# Patient Record
Sex: Male | Born: 1950 | Race: White | Hispanic: No | Marital: Married | Smoking: Current every day smoker
Health system: Southern US, Community
[De-identification: ages and names within clinical notes are randomized; demographics above are authoritative.]

## PROBLEM LIST (undated history)

## (undated) DIAGNOSIS — M1990 Unspecified osteoarthritis, unspecified site: Secondary | ICD-10-CM

## (undated) DIAGNOSIS — R011 Cardiac murmur, unspecified: Secondary | ICD-10-CM

## (undated) DIAGNOSIS — R0683 Snoring: Secondary | ICD-10-CM

## (undated) DIAGNOSIS — M2240 Chondromalacia patellae, unspecified knee: Secondary | ICD-10-CM

## (undated) DIAGNOSIS — I10 Essential (primary) hypertension: Secondary | ICD-10-CM

## (undated) HISTORY — PX: BACK SURGERY: SHX140

## (undated) HISTORY — PX: TONSILLECTOMY: SUR1361

## (undated) HISTORY — PX: BUNIONECTOMY: SHX129

---

## 2002-04-08 ENCOUNTER — Emergency Department (HOSPITAL_COMMUNITY): Admission: EM | Admit: 2002-04-08 | Discharge: 2002-04-08 | Payer: Self-pay | Admitting: Emergency Medicine

## 2015-02-14 ENCOUNTER — Encounter (HOSPITAL_BASED_OUTPATIENT_CLINIC_OR_DEPARTMENT_OTHER): Payer: Self-pay | Admitting: *Deleted

## 2015-02-14 ENCOUNTER — Other Ambulatory Visit: Payer: Self-pay | Admitting: Physician Assistant

## 2015-02-14 NOTE — H&P (Signed)
Thomas Atkinson is seen in follow up after his MRI is complete.  Tear of the posterior horn medial meniscus.  Looking at the scan these are a little bit more pronounced than the report would suggest.  There is definitely a tear present.  There is some mucoid degeneration of his ACL with a ganglion cyst in his ACL.  Mild to moderate tricompartmental changes, however his standing four view x-ray really does look quite good.  He continues to have marked mechanical symptoms when he goes into flexion.  He feels like this is more lateral, but his scan suggests more of a medial meniscus tear.  He does have some fullness in the back of his knee and the scan does show a moderate Bakers cyst.  I have gone over his scan, his x-rays, workup and treatment to date.    EXAMINATION: Lungs clear to auscultation bilaterally.  Hears sounds normal.  Exam of his right knee: his ACL definitely feels stable.  Positive medial McMurray's.  A little sore laterally.    DISPOSITION:  We have discussed definitive treatment.  Exam under anesthesia, arthroscopy.  He is having marked symptoms.  Anything he does that is not straight ahead causes marked mechanical symptoms.  He would like to proceed.  I think this is the most reasonable approach to take.  Paperwork complete.  All questions answered.  I will see him at the time of operative intervention.    Loreta Aveaniel F. Murphy, M.D.

## 2015-02-17 ENCOUNTER — Ambulatory Visit (HOSPITAL_BASED_OUTPATIENT_CLINIC_OR_DEPARTMENT_OTHER): Payer: PPO | Admitting: Anesthesiology

## 2015-02-17 ENCOUNTER — Encounter (HOSPITAL_BASED_OUTPATIENT_CLINIC_OR_DEPARTMENT_OTHER)
Admission: RE | Disposition: A | Discharge status: Home / Self Care | Payer: Self-pay | Source: Ambulatory Visit | Attending: Orthopedic Surgery

## 2015-02-17 ENCOUNTER — Encounter (HOSPITAL_BASED_OUTPATIENT_CLINIC_OR_DEPARTMENT_OTHER): Payer: Self-pay | Admitting: *Deleted

## 2015-02-17 ENCOUNTER — Ambulatory Visit (HOSPITAL_BASED_OUTPATIENT_CLINIC_OR_DEPARTMENT_OTHER)
Admission: RE | Admit: 2015-02-17 | Discharge: 2015-02-17 | Disposition: A | Discharge status: Home / Self Care | Payer: PPO | Source: Ambulatory Visit | Attending: Orthopedic Surgery | Admitting: Orthopedic Surgery

## 2015-02-17 DIAGNOSIS — M2241 Chondromalacia patellae, right knee: Secondary | ICD-10-CM | POA: Diagnosis not present

## 2015-02-17 DIAGNOSIS — F17200 Nicotine dependence, unspecified, uncomplicated: Secondary | ICD-10-CM | POA: Insufficient documentation

## 2015-02-17 DIAGNOSIS — M23321 Other meniscus derangements, posterior horn of medial meniscus, right knee: Secondary | ICD-10-CM | POA: Diagnosis not present

## 2015-02-17 DIAGNOSIS — M238X1 Other internal derangements of right knee: Secondary | ICD-10-CM | POA: Diagnosis not present

## 2015-02-17 DIAGNOSIS — M67461 Ganglion, right knee: Secondary | ICD-10-CM | POA: Diagnosis not present

## 2015-02-17 DIAGNOSIS — I10 Essential (primary) hypertension: Secondary | ICD-10-CM | POA: Diagnosis not present

## 2015-02-17 DIAGNOSIS — M7121 Synovial cyst of popliteal space [Baker], right knee: Secondary | ICD-10-CM | POA: Diagnosis not present

## 2015-02-17 DIAGNOSIS — M23221 Derangement of posterior horn of medial meniscus due to old tear or injury, right knee: Secondary | ICD-10-CM | POA: Insufficient documentation

## 2015-02-17 HISTORY — DX: Snoring: R06.83

## 2015-02-17 HISTORY — DX: Essential (primary) hypertension: I10

## 2015-02-17 HISTORY — DX: Unspecified osteoarthritis, unspecified site: M19.90

## 2015-02-17 HISTORY — DX: Cardiac murmur, unspecified: R01.1

## 2015-02-17 HISTORY — PX: KNEE ARTHROSCOPY WITH MEDIAL MENISECTOMY: SHX5651

## 2015-02-17 HISTORY — DX: Chondromalacia patellae, unspecified knee: M22.40

## 2015-02-17 SURGERY — ARTHROSCOPY, KNEE, WITH MEDIAL MENISCECTOMY
Anesthesia: General | Site: Knee | Laterality: Right

## 2015-02-17 MED ORDER — EPHEDRINE SULFATE 50 MG/ML IJ SOLN
INTRAMUSCULAR | Status: DC | PRN
  Administered 2015-02-17: 5 mg via INTRAVENOUS
  Administered 2015-02-17: 10 mg via INTRAVENOUS

## 2015-02-17 MED ORDER — BUPIVACAINE HCL (PF) 0.5 % IJ SOLN
INTRAMUSCULAR | Status: AC
  Filled 2015-02-17: qty 60

## 2015-02-17 MED ORDER — CEFAZOLIN SODIUM-DEXTROSE 2-3 GM-% IV SOLR
INTRAVENOUS | Status: AC
  Filled 2015-02-17: qty 50

## 2015-02-17 MED ORDER — CEFAZOLIN SODIUM-DEXTROSE 2-3 GM-% IV SOLR
2.0000 g | INTRAVENOUS | Status: AC
  Administered 2015-02-17: 2 g via INTRAVENOUS

## 2015-02-17 MED ORDER — ONDANSETRON HCL 4 MG PO TABS
4.0000 mg | ORAL_TABLET | Freq: Four times a day (QID) | ORAL | Status: DC | PRN
Start: 2015-02-17 — End: 2015-02-17

## 2015-02-17 MED ORDER — PROPOFOL 10 MG/ML IV BOLUS
INTRAVENOUS | Status: DC | PRN
  Administered 2015-02-17: 200 mg via INTRAVENOUS

## 2015-02-17 MED ORDER — ONDANSETRON HCL 4 MG/2ML IJ SOLN
INTRAMUSCULAR | Status: DC | PRN
  Administered 2015-02-17: 4 mg via INTRAVENOUS

## 2015-02-17 MED ORDER — ONDANSETRON HCL 4 MG/2ML IJ SOLN: 4.0000 mg | Freq: Once | INTRAMUSCULAR | Status: DC | PRN

## 2015-02-17 MED ORDER — METHYLPREDNISOLONE ACETATE 80 MG/ML IJ SUSP
INTRAMUSCULAR | Status: DC | PRN
  Administered 2015-02-17: 80 mg

## 2015-02-17 MED ORDER — BUPIVACAINE HCL (PF) 0.25 % IJ SOLN
INTRAMUSCULAR | Status: AC
  Filled 2015-02-17: qty 60

## 2015-02-17 MED ORDER — ONDANSETRON HCL 4 MG/2ML IJ SOLN: 4.0000 mg | Freq: Four times a day (QID) | INTRAMUSCULAR | Status: DC | PRN

## 2015-02-17 MED ORDER — MIDAZOLAM HCL 2 MG/2ML IJ SOLN
1.0000 mg | INTRAMUSCULAR | Status: DC | PRN
  Administered 2015-02-17: 2 mg via INTRAVENOUS

## 2015-02-17 MED ORDER — OXYCODONE-ACETAMINOPHEN 5-325 MG PO TABS: 1.0000 | ORAL_TABLET | ORAL | Status: DC | PRN

## 2015-02-17 MED ORDER — KETOROLAC TROMETHAMINE 30 MG/ML IJ SOLN
INTRAMUSCULAR | Status: DC | PRN
  Administered 2015-02-17: 30 mg via INTRAVENOUS

## 2015-02-17 MED ORDER — OXYCODONE-ACETAMINOPHEN 5-325 MG PO TABS
ORAL_TABLET | ORAL | Status: AC
  Filled 2015-02-17: qty 1

## 2015-02-17 MED ORDER — METOCLOPRAMIDE HCL 5 MG/ML IJ SOLN: 5.0000 mg | Freq: Three times a day (TID) | INTRAMUSCULAR | Status: DC | PRN

## 2015-02-17 MED ORDER — BUPIVACAINE HCL (PF) 0.5 % IJ SOLN
INTRAMUSCULAR | Status: DC | PRN
  Administered 2015-02-17: 20 mL

## 2015-02-17 MED ORDER — LACTATED RINGERS IV SOLN
INTRAVENOUS | Status: DC
Start: 2015-02-17 — End: 2015-02-17
  Administered 2015-02-17 (×2): via INTRAVENOUS

## 2015-02-17 MED ORDER — METHYLPREDNISOLONE ACETATE 80 MG/ML IJ SUSP
INTRAMUSCULAR | Status: AC
  Filled 2015-02-17: qty 1

## 2015-02-17 MED ORDER — LIDOCAINE HCL (CARDIAC) 20 MG/ML IV SOLN
INTRAVENOUS | Status: DC | PRN
  Administered 2015-02-17: 50 mg via INTRAVENOUS

## 2015-02-17 MED ORDER — DEXAMETHASONE SODIUM PHOSPHATE 10 MG/ML IJ SOLN
INTRAMUSCULAR | Status: AC
  Filled 2015-02-17: qty 1

## 2015-02-17 MED ORDER — METHOCARBAMOL 1000 MG/10ML IJ SOLN: 500.0000 mg | Freq: Four times a day (QID) | INTRAMUSCULAR | Status: DC | PRN

## 2015-02-17 MED ORDER — MIDAZOLAM HCL 2 MG/2ML IJ SOLN
INTRAMUSCULAR | Status: AC
Start: 2015-02-17 — End: 2015-02-17
  Filled 2015-02-17: qty 2

## 2015-02-17 MED ORDER — METHOCARBAMOL 500 MG PO TABS: 500.0000 mg | ORAL_TABLET | Freq: Four times a day (QID) | ORAL | Status: DC | PRN

## 2015-02-17 MED ORDER — CHLORHEXIDINE GLUCONATE 4 % EX LIQD: 60.0000 mL | Freq: Once | CUTANEOUS | Status: DC

## 2015-02-17 MED ORDER — METOCLOPRAMIDE HCL 5 MG PO TABS: 5.0000 mg | ORAL_TABLET | Freq: Three times a day (TID) | ORAL | Status: DC | PRN

## 2015-02-17 MED ORDER — SCOPOLAMINE 1 MG/3DAYS TD PT72: 1.0000 | MEDICATED_PATCH | Freq: Once | TRANSDERMAL | Status: DC

## 2015-02-17 MED ORDER — FENTANYL CITRATE (PF) 100 MCG/2ML IJ SOLN
50.0000 ug | INTRAMUSCULAR | Status: DC | PRN
  Administered 2015-02-17: 100 ug via INTRAVENOUS

## 2015-02-17 MED ORDER — ONDANSETRON HCL 4 MG PO TABS: 4.0000 mg | ORAL_TABLET | Freq: Three times a day (TID) | ORAL | Status: DC | PRN

## 2015-02-17 MED ORDER — FENTANYL CITRATE (PF) 100 MCG/2ML IJ SOLN
INTRAMUSCULAR | Status: AC
  Filled 2015-02-17: qty 2

## 2015-02-17 MED ORDER — OXYCODONE-ACETAMINOPHEN 5-325 MG PO TABS
1.0000 | ORAL_TABLET | ORAL | Status: DC | PRN
  Administered 2015-02-17: 1 via ORAL

## 2015-02-17 MED ORDER — ONDANSETRON HCL 4 MG/2ML IJ SOLN
INTRAMUSCULAR | Status: AC
  Filled 2015-02-17: qty 2

## 2015-02-17 MED ORDER — LACTATED RINGERS IV SOLN: INTRAVENOUS | Status: DC

## 2015-02-17 MED ORDER — HYDROMORPHONE HCL 1 MG/ML IJ SOLN
INTRAMUSCULAR | Status: AC
  Filled 2015-02-17: qty 1

## 2015-02-17 MED ORDER — LIDOCAINE HCL (CARDIAC) 20 MG/ML IV SOLN
INTRAVENOUS | Status: AC
  Filled 2015-02-17: qty 5

## 2015-02-17 MED ORDER — HYDROMORPHONE HCL 1 MG/ML IJ SOLN
0.5000 mg | INTRAMUSCULAR | Status: DC | PRN
  Administered 2015-02-17 (×2): 0.5 mg via INTRAVENOUS

## 2015-02-17 MED ORDER — PROPOFOL 500 MG/50ML IV EMUL
INTRAVENOUS | Status: AC
  Filled 2015-02-17: qty 50

## 2015-02-17 MED ORDER — GLYCOPYRROLATE 0.2 MG/ML IJ SOLN: 0.2000 mg | Freq: Once | INTRAMUSCULAR | Status: DC | PRN

## 2015-02-17 MED ORDER — SODIUM CHLORIDE 0.9 % IR SOLN
Status: DC | PRN
  Administered 2015-02-17: 3000 mL

## 2015-02-17 MED ORDER — HYDROMORPHONE HCL 1 MG/ML IJ SOLN: 0.5000 mg | INTRAMUSCULAR | Status: DC | PRN

## 2015-02-17 MED ORDER — DEXAMETHASONE SODIUM PHOSPHATE 10 MG/ML IJ SOLN
INTRAMUSCULAR | Status: DC | PRN
  Administered 2015-02-17: 10 mg via INTRAVENOUS

## 2015-02-17 SURGICAL SUPPLY — 40 items
BANDAGE ACE 6X5 VEL STRL LF (GAUZE/BANDAGES/DRESSINGS) ×3 IMPLANT
BLADE CUDA 5.5 (BLADE) IMPLANT
BLADE CUDA GRT WHITE 3.5 (BLADE) IMPLANT
BLADE CUTTER GATOR 3.5 (BLADE) ×3 IMPLANT
BLADE CUTTER MENIS 5.5 (BLADE) IMPLANT
BLADE GREAT WHITE 4.2 (BLADE) ×2 IMPLANT
BLADE GREAT WHITE 4.2MM (BLADE) ×1
BUR OVAL 4.0 (BURR) IMPLANT
CUTTER MENISCUS  4.2MM (BLADE)
CUTTER MENISCUS 4.2MM (BLADE) IMPLANT
DRAPE ARTHROSCOPY W/POUCH 90 (DRAPES) ×3 IMPLANT
DURAPREP 26ML APPLICATOR (WOUND CARE) ×3 IMPLANT
ELECT MENISCUS 165MM 90D (ELECTRODE) IMPLANT
ELECT REM PT RETURN 9FT ADLT (ELECTROSURGICAL)
ELECTRODE REM PT RTRN 9FT ADLT (ELECTROSURGICAL) IMPLANT
GAUZE SPONGE 4X4 12PLY STRL (GAUZE/BANDAGES/DRESSINGS) ×3 IMPLANT
GAUZE XEROFORM 1X8 LF (GAUZE/BANDAGES/DRESSINGS) ×3 IMPLANT
GLOVE BIO SURGEON STRL SZ 6.5 (GLOVE) ×2 IMPLANT
GLOVE BIO SURGEONS STRL SZ 6.5 (GLOVE) ×1
GLOVE BIOGEL PI IND STRL 7.0 (GLOVE) ×3 IMPLANT
GLOVE BIOGEL PI INDICATOR 7.0 (GLOVE) ×6
GLOVE ECLIPSE 7.0 STRL STRAW (GLOVE) ×3 IMPLANT
GLOVE SURG ORTHO 8.0 STRL STRW (GLOVE) ×3 IMPLANT
GOWN STRL REUS W/ TWL LRG LVL3 (GOWN DISPOSABLE) ×2 IMPLANT
GOWN STRL REUS W/ TWL XL LVL3 (GOWN DISPOSABLE) ×1 IMPLANT
GOWN STRL REUS W/TWL LRG LVL3 (GOWN DISPOSABLE) ×4
GOWN STRL REUS W/TWL XL LVL3 (GOWN DISPOSABLE) ×2
HOLDER KNEE FOAM BLUE (MISCELLANEOUS) ×3 IMPLANT
IV NS IRRIG 3000ML ARTHROMATIC (IV SOLUTION) ×6 IMPLANT
KNEE WRAP E Z 3 GEL PACK (MISCELLANEOUS) ×3 IMPLANT
MANIFOLD NEPTUNE II (INSTRUMENTS) ×3 IMPLANT
PACK ARTHROSCOPY DSU (CUSTOM PROCEDURE TRAY) ×3 IMPLANT
PACK BASIN DAY SURGERY FS (CUSTOM PROCEDURE TRAY) ×3 IMPLANT
PENCIL BUTTON HOLSTER BLD 10FT (ELECTRODE) IMPLANT
SET ARTHROSCOPY TUBING (MISCELLANEOUS) ×2
SET ARTHROSCOPY TUBING LN (MISCELLANEOUS) ×1 IMPLANT
SUT ETHILON 3 0 PS 1 (SUTURE) ×3 IMPLANT
SUT VIC AB 3-0 FS2 27 (SUTURE) IMPLANT
TOWEL OR 17X24 6PK STRL BLUE (TOWEL DISPOSABLE) ×3 IMPLANT
WATER STERILE IRR 1000ML POUR (IV SOLUTION) ×3 IMPLANT

## 2015-02-17 NOTE — Anesthesia Preprocedure Evaluation (Signed)
Anesthesia Evaluation  Patient identified by MRN, date of birth, ID band Patient awake    Reviewed: Allergy & Precautions, NPO status , Patient's Chart, lab work & pertinent test results  Airway Mallampati: I  TM Distance: >3 FB     Dental   Pulmonary Current Smoker,    Pulmonary exam normal        Cardiovascular hypertension, Normal cardiovascular exam     Neuro/Psych    GI/Hepatic   Endo/Other    Renal/GU      Musculoskeletal  (+) Arthritis ,   Abdominal   Peds  Hematology   Anesthesia Other Findings   Reproductive/Obstetrics                             Anesthesia Physical Anesthesia Plan  ASA: II  Anesthesia Plan: General   Post-op Pain Management:    Induction: Intravenous  Airway Management Planned: LMA and Oral ETT  Additional Equipment:   Intra-op Plan:   Post-operative Plan: Extubation in OR  Informed Consent: I have reviewed the patients History and Physical, chart, labs and discussed the procedure including the risks, benefits and alternatives for the proposed anesthesia with the patient or authorized representative who has indicated his/her understanding and acceptance.     Plan Discussed with: CRNA, Anesthesiologist and Surgeon  Anesthesia Plan Comments:         Anesthesia Quick Evaluation

## 2015-02-17 NOTE — Anesthesia Procedure Notes (Signed)
Procedure Name: LMA Insertion Date/Time: 02/17/2015 7:35 AM Performed by: Caren MacadamARTER, Ashely Joshua W Pre-anesthesia Checklist: Patient identified, Emergency Drugs available, Suction available and Patient being monitored Patient Re-evaluated:Patient Re-evaluated prior to inductionOxygen Delivery Method: Circle System Utilized Preoxygenation: Pre-oxygenation with 100% oxygen Intubation Type: IV induction Ventilation: Mask ventilation without difficulty LMA: LMA inserted LMA Size: 5.0 Number of attempts: 1 Airway Equipment and Method: Bite block Placement Confirmation: positive ETCO2 and breath sounds checked- equal and bilateral Tube secured with: Tape Dental Injury: Teeth and Oropharynx as per pre-operative assessment

## 2015-02-17 NOTE — Anesthesia Postprocedure Evaluation (Signed)
Anesthesia Post Note  Patient: Thomas Atkinson  Procedure(s) Performed: Procedure(s) (LRB): RIGHT KNEE ARTHROSCOPY CHONDROPLASTY WITH MEDIAL MENISCECTOMY (Right)  Patient location during evaluation: PACU Anesthesia Type: General and Regional Level of consciousness: awake, oriented and patient cooperative Pain management: pain level controlled Vital Signs Assessment: post-procedure vital signs reviewed and stable Respiratory status: spontaneous breathing and respiratory function stable Cardiovascular status: blood pressure returned to baseline and stable Anesthetic complications: no    Last Vitals:  Filed Vitals:   02/17/15 0900 02/17/15 0937  BP: 132/75 142/85  Pulse: 70 64  Temp:  36.4 C  Resp:  16    Last Pain:  Filed Vitals:   02/17/15 0939  PainSc: 2                  Kyia Rhude EDWARD

## 2015-02-17 NOTE — Discharge Instructions (Signed)

## 2015-02-17 NOTE — Transfer of Care (Signed)
Immediate Anesthesia Transfer of Care Note  Patient: Thomas Atkinson P Berberich  Procedure(s) Performed: Procedure(s): RIGHT KNEE ARTHROSCOPY CHONDROPLASTY WITH MEDIAL MENISCECTOMY (Right)  Patient Location: PACU  Anesthesia Type:General  Level of Consciousness: sedated  Airway & Oxygen Therapy: Patient Spontanous Breathing and Patient connected to face mask oxygen  Post-op Assessment: Report given to RN and Post -op Vital signs reviewed and stable  Post vital signs: Reviewed and stable  Last Vitals:  Filed Vitals:   02/17/15 0622  BP: 168/101  Pulse: 78  Temp: 36.7 C  Resp: 18    Complications: No apparent anesthesia complications

## 2015-02-17 NOTE — Interval H&P Note (Signed)
History and Physical Interval Note:  02/17/2015 7:28 AM  Thomas Atkinson  has presented today for surgery, with the diagnosis of CHONDROMALACIA PATELLEA RIGHT KNEE OTHER MENISCUS DERANGEMENTS POSTERIOR HORN OF MEDIAL MENISCUS RIGHT KNEE   The various methods of treatment have been discussed with the patient and family. After consideration of risks, benefits and other options for treatment, the patient has consented to  Procedure(s): RIGHT KNEE ARTHROSCOPY CHONDROPLASTY WITH MEDIAL MENISCECTOMY (Right) as a surgical intervention .  The patient's history has been reviewed, patient examined, no change in status, stable for surgery.  I have reviewed the patient's chart and labs.  Questions were answered to the patient's satisfaction.     Loreta Aveaniel F Crytal Pensinger

## 2015-02-18 ENCOUNTER — Encounter (HOSPITAL_BASED_OUTPATIENT_CLINIC_OR_DEPARTMENT_OTHER): Payer: Self-pay | Admitting: Orthopedic Surgery

## 2015-02-18 NOTE — Op Note (Signed)
NAMLavena Bullion:  Atkinson, Thomas               ACCOUNT NO.:  [id number]  MEDICAL RECORD NO.:  [id number]  LOCATION:                                 FACILITY:  PHYSICIAN:  Loreta Aveaniel F. Benjamen Koelling, M.D.      DATE OF BIRTH:  DATE OF PROCEDURE:  02/17/2015 DATE OF DISCHARGE:                              OPERATIVE REPORT   PREOPERATIVE DIAGNOSIS:  Right knee medial meniscus tear.  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  Attrition, degeneration of anterior cruciate ligament, but still intact and functional.  PROCEDURES:  Right knee examination under anesthesia and arthroscopy. Partial medial meniscectomy.  Chondroplasty of patellofemoral joint.  SURGEON:  Loreta Aveaniel F. Daurice Ovando, M.D.  ASSISTANT:  Mikey KirschnerLindsey Stanberry, PA  ANESTHESIA:  General.  BLOOD LOSS:  Minimal.  SPECIMENS:  None.  CULTURES:  None.  COMPLICATIONS:  None.  DRESSINGS:  Soft compressive.  TOURNIQUET:  Not employed.  DESCRIPTION OF PROCEDURE:  The patient was brought to the operating room and placed on the operating table in supine position.  After adequate anesthesia had been obtained, leg holder applied, leg prepped and draped in usual sterile fashion.  Two portals, one each medial and lateral parapatellar.  Arthroscope was introduced.  Knee was distended and inspected.  Good patellar tracking.  Focal grade 2 and 3 chondromalacia peak of patella debrided.  This was relatively isolated.  Trochlea looked good.  Remaining articular cartilage looked good.  Lateral meniscus intact.  Attrition in the ACL degeneration, but was still intact, functional and came to an endpoint.  Complex tearing of medial meniscus, posterior third.  Taken down to a stable rim, tapered in smoothly.  Really minimal degenerative changes without compartment.  Instruments and fluid removed.  Portals were closed with nylon.  Sterile compressive dressing applied after knee injected  with Depo-Medrol and Marcaine.  Anesthesia reversed.  Brought to the recovery room.  Tolerated the surgery well.  No complications.     Loreta Aveaniel F. Raif Chachere, M.D.     DFM/MEDQ  D:  02/17/2015  T:  02/17/2015  Job:  [id number]

## 2015-02-25 DIAGNOSIS — M23321 Other meniscus derangements, posterior horn of medial meniscus, right knee: Secondary | ICD-10-CM | POA: Diagnosis not present

## 2015-04-01 ENCOUNTER — Ambulatory Visit (HOSPITAL_COMMUNITY)
Admission: RE | Admit: 2015-04-01 | Discharge: 2015-04-01 | Disposition: A | Discharge status: Home / Self Care | Payer: PPO | Source: Ambulatory Visit | Attending: Cardiology | Admitting: Cardiology

## 2015-04-01 ENCOUNTER — Other Ambulatory Visit (HOSPITAL_COMMUNITY): Payer: Self-pay | Admitting: Orthopedic Surgery

## 2015-04-01 DIAGNOSIS — Z720 Tobacco use: Secondary | ICD-10-CM | POA: Insufficient documentation

## 2015-04-01 DIAGNOSIS — M7989 Other specified soft tissue disorders: Secondary | ICD-10-CM

## 2015-04-01 DIAGNOSIS — I10 Essential (primary) hypertension: Secondary | ICD-10-CM | POA: Diagnosis not present

## 2015-04-01 DIAGNOSIS — M79604 Pain in right leg: Secondary | ICD-10-CM | POA: Diagnosis not present

## 2015-04-01 DIAGNOSIS — M23321 Other meniscus derangements, posterior horn of medial meniscus, right knee: Secondary | ICD-10-CM | POA: Diagnosis not present

## 2015-04-05 DIAGNOSIS — M23321 Other meniscus derangements, posterior horn of medial meniscus, right knee: Secondary | ICD-10-CM | POA: Diagnosis not present

## 2015-05-30 DIAGNOSIS — R002 Palpitations: Secondary | ICD-10-CM | POA: Diagnosis not present

## 2015-05-30 DIAGNOSIS — F1010 Alcohol abuse, uncomplicated: Secondary | ICD-10-CM | POA: Diagnosis not present

## 2015-05-30 DIAGNOSIS — M25569 Pain in unspecified knee: Secondary | ICD-10-CM | POA: Diagnosis not present

## 2015-05-30 DIAGNOSIS — I10 Essential (primary) hypertension: Secondary | ICD-10-CM | POA: Diagnosis not present

## 2015-07-22 DIAGNOSIS — E785 Hyperlipidemia, unspecified: Secondary | ICD-10-CM | POA: Diagnosis not present

## 2015-07-22 DIAGNOSIS — R739 Hyperglycemia, unspecified: Secondary | ICD-10-CM | POA: Diagnosis not present

## 2015-07-22 DIAGNOSIS — I10 Essential (primary) hypertension: Secondary | ICD-10-CM | POA: Diagnosis not present

## 2015-07-22 DIAGNOSIS — E782 Mixed hyperlipidemia: Secondary | ICD-10-CM | POA: Diagnosis not present

## 2015-07-22 DIAGNOSIS — F1010 Alcohol abuse, uncomplicated: Secondary | ICD-10-CM | POA: Diagnosis not present

## 2016-03-12 DIAGNOSIS — E785 Hyperlipidemia, unspecified: Secondary | ICD-10-CM | POA: Diagnosis not present

## 2016-03-12 DIAGNOSIS — Z23 Encounter for immunization: Secondary | ICD-10-CM | POA: Diagnosis not present

## 2016-03-12 DIAGNOSIS — K219 Gastro-esophageal reflux disease without esophagitis: Secondary | ICD-10-CM | POA: Diagnosis not present

## 2016-03-12 DIAGNOSIS — Z0000 Encounter for general adult medical examination without abnormal findings: Secondary | ICD-10-CM | POA: Diagnosis not present

## 2016-03-12 DIAGNOSIS — Z1159 Encounter for screening for other viral diseases: Secondary | ICD-10-CM | POA: Diagnosis not present

## 2016-03-12 DIAGNOSIS — I10 Essential (primary) hypertension: Secondary | ICD-10-CM | POA: Diagnosis not present

## 2016-03-12 DIAGNOSIS — E782 Mixed hyperlipidemia: Secondary | ICD-10-CM | POA: Diagnosis not present

## 2016-03-12 DIAGNOSIS — Z125 Encounter for screening for malignant neoplasm of prostate: Secondary | ICD-10-CM | POA: Diagnosis not present

## 2016-03-12 DIAGNOSIS — F1010 Alcohol abuse, uncomplicated: Secondary | ICD-10-CM | POA: Diagnosis not present

## 2016-03-12 DIAGNOSIS — R7303 Prediabetes: Secondary | ICD-10-CM | POA: Diagnosis not present

## 2016-03-12 DIAGNOSIS — G4700 Insomnia, unspecified: Secondary | ICD-10-CM | POA: Diagnosis not present

## 2016-11-12 DIAGNOSIS — R7303 Prediabetes: Secondary | ICD-10-CM | POA: Diagnosis not present

## 2016-11-12 DIAGNOSIS — I10 Essential (primary) hypertension: Secondary | ICD-10-CM | POA: Diagnosis not present

## 2016-11-12 DIAGNOSIS — Z720 Tobacco use: Secondary | ICD-10-CM | POA: Diagnosis not present

## 2016-11-12 DIAGNOSIS — E785 Hyperlipidemia, unspecified: Secondary | ICD-10-CM | POA: Diagnosis not present

## 2017-02-13 ENCOUNTER — Inpatient Hospital Stay (HOSPITAL_COMMUNITY): Payer: PPO

## 2017-02-13 ENCOUNTER — Inpatient Hospital Stay (HOSPITAL_COMMUNITY): Admission: EM | Disposition: E | Payer: Self-pay | Source: Home / Self Care | Attending: Critical Care Medicine

## 2017-02-13 ENCOUNTER — Other Ambulatory Visit: Payer: Self-pay

## 2017-02-13 ENCOUNTER — Emergency Department (HOSPITAL_COMMUNITY): Payer: PPO

## 2017-02-13 ENCOUNTER — Encounter (HOSPITAL_COMMUNITY): Payer: Self-pay | Admitting: Emergency Medicine

## 2017-02-13 ENCOUNTER — Emergency Department (HOSPITAL_COMMUNITY): Payer: PPO | Admitting: Certified Registered"

## 2017-02-13 ENCOUNTER — Encounter (HOSPITAL_COMMUNITY): Admission: EM | Disposition: E | Payer: Self-pay | Source: Home / Self Care | Attending: Critical Care Medicine

## 2017-02-13 ENCOUNTER — Inpatient Hospital Stay (HOSPITAL_COMMUNITY)
Admission: EM | Admit: 2017-02-13 | Discharge: 2017-03-08 | DRG: 268 | Disposition: E | Payer: PPO | Attending: Critical Care Medicine | Admitting: Critical Care Medicine

## 2017-02-13 DIAGNOSIS — I110 Hypertensive heart disease with heart failure: Secondary | ICD-10-CM | POA: Diagnosis present

## 2017-02-13 DIAGNOSIS — G9341 Metabolic encephalopathy: Secondary | ICD-10-CM | POA: Diagnosis not present

## 2017-02-13 DIAGNOSIS — Z9289 Personal history of other medical treatment: Secondary | ICD-10-CM

## 2017-02-13 DIAGNOSIS — E874 Mixed disorder of acid-base balance: Secondary | ICD-10-CM | POA: Diagnosis not present

## 2017-02-13 DIAGNOSIS — Z452 Encounter for adjustment and management of vascular access device: Secondary | ICD-10-CM

## 2017-02-13 DIAGNOSIS — E669 Obesity, unspecified: Secondary | ICD-10-CM | POA: Diagnosis present

## 2017-02-13 DIAGNOSIS — T82538A Leakage of other cardiac and vascular devices and implants, initial encounter: Secondary | ICD-10-CM

## 2017-02-13 DIAGNOSIS — I213 ST elevation (STEMI) myocardial infarction of unspecified site: Secondary | ICD-10-CM | POA: Diagnosis not present

## 2017-02-13 DIAGNOSIS — Z9889 Other specified postprocedural states: Secondary | ICD-10-CM

## 2017-02-13 DIAGNOSIS — I70203 Unspecified atherosclerosis of native arteries of extremities, bilateral legs: Secondary | ICD-10-CM | POA: Diagnosis not present

## 2017-02-13 DIAGNOSIS — D62 Acute posthemorrhagic anemia: Secondary | ICD-10-CM | POA: Diagnosis not present

## 2017-02-13 DIAGNOSIS — I713 Abdominal aortic aneurysm, ruptured, unspecified: Secondary | ICD-10-CM | POA: Diagnosis present

## 2017-02-13 DIAGNOSIS — N179 Acute kidney failure, unspecified: Secondary | ICD-10-CM | POA: Diagnosis not present

## 2017-02-13 DIAGNOSIS — E875 Hyperkalemia: Secondary | ICD-10-CM | POA: Diagnosis not present

## 2017-02-13 DIAGNOSIS — Z66 Do not resuscitate: Secondary | ICD-10-CM | POA: Diagnosis not present

## 2017-02-13 DIAGNOSIS — I509 Heart failure, unspecified: Secondary | ICD-10-CM | POA: Diagnosis not present

## 2017-02-13 DIAGNOSIS — I959 Hypotension, unspecified: Secondary | ICD-10-CM | POA: Diagnosis not present

## 2017-02-13 DIAGNOSIS — Z6830 Body mass index (BMI) 30.0-30.9, adult: Secondary | ICD-10-CM

## 2017-02-13 DIAGNOSIS — R579 Shock, unspecified: Secondary | ICD-10-CM | POA: Diagnosis not present

## 2017-02-13 DIAGNOSIS — D65 Disseminated intravascular coagulation [defibrination syndrome]: Secondary | ICD-10-CM | POA: Diagnosis not present

## 2017-02-13 DIAGNOSIS — M79A22 Nontraumatic compartment syndrome of left lower extremity: Secondary | ICD-10-CM | POA: Diagnosis not present

## 2017-02-13 DIAGNOSIS — M79A21 Nontraumatic compartment syndrome of right lower extremity: Secondary | ICD-10-CM | POA: Diagnosis not present

## 2017-02-13 DIAGNOSIS — R079 Chest pain, unspecified: Secondary | ICD-10-CM | POA: Diagnosis not present

## 2017-02-13 DIAGNOSIS — G4733 Obstructive sleep apnea (adult) (pediatric): Secondary | ICD-10-CM | POA: Diagnosis not present

## 2017-02-13 DIAGNOSIS — R000 Tachycardia, unspecified: Secondary | ICD-10-CM | POA: Diagnosis not present

## 2017-02-13 DIAGNOSIS — J9601 Acute respiratory failure with hypoxia: Secondary | ICD-10-CM | POA: Diagnosis not present

## 2017-02-13 DIAGNOSIS — I7100 Dissection of unspecified site of aorta: Secondary | ICD-10-CM | POA: Diagnosis not present

## 2017-02-13 DIAGNOSIS — Z9911 Dependence on respirator [ventilator] status: Secondary | ICD-10-CM

## 2017-02-13 DIAGNOSIS — I469 Cardiac arrest, cause unspecified: Secondary | ICD-10-CM | POA: Diagnosis not present

## 2017-02-13 DIAGNOSIS — J449 Chronic obstructive pulmonary disease, unspecified: Secondary | ICD-10-CM | POA: Diagnosis present

## 2017-02-13 DIAGNOSIS — R0602 Shortness of breath: Secondary | ICD-10-CM | POA: Diagnosis not present

## 2017-02-13 DIAGNOSIS — R570 Cardiogenic shock: Secondary | ICD-10-CM | POA: Diagnosis not present

## 2017-02-13 DIAGNOSIS — I4901 Ventricular fibrillation: Secondary | ICD-10-CM | POA: Diagnosis not present

## 2017-02-13 DIAGNOSIS — R001 Bradycardia, unspecified: Secondary | ICD-10-CM | POA: Diagnosis not present

## 2017-02-13 DIAGNOSIS — I2510 Atherosclerotic heart disease of native coronary artery without angina pectoris: Secondary | ICD-10-CM | POA: Diagnosis not present

## 2017-02-13 DIAGNOSIS — J9602 Acute respiratory failure with hypercapnia: Secondary | ICD-10-CM | POA: Diagnosis not present

## 2017-02-13 DIAGNOSIS — I10 Essential (primary) hypertension: Secondary | ICD-10-CM | POA: Diagnosis not present

## 2017-02-13 DIAGNOSIS — G8918 Other acute postprocedural pain: Secondary | ICD-10-CM | POA: Diagnosis present

## 2017-02-13 DIAGNOSIS — Z79899 Other long term (current) drug therapy: Secondary | ICD-10-CM

## 2017-02-13 DIAGNOSIS — R140 Abdominal distension (gaseous): Secondary | ICD-10-CM | POA: Diagnosis not present

## 2017-02-13 DIAGNOSIS — R918 Other nonspecific abnormal finding of lung field: Secondary | ICD-10-CM | POA: Diagnosis not present

## 2017-02-13 DIAGNOSIS — I361 Nonrheumatic tricuspid (valve) insufficiency: Secondary | ICD-10-CM | POA: Diagnosis not present

## 2017-02-13 DIAGNOSIS — R739 Hyperglycemia, unspecified: Secondary | ICD-10-CM | POA: Diagnosis not present

## 2017-02-13 DIAGNOSIS — R1032 Left lower quadrant pain: Secondary | ICD-10-CM | POA: Diagnosis not present

## 2017-02-13 DIAGNOSIS — R109 Unspecified abdominal pain: Secondary | ICD-10-CM | POA: Diagnosis not present

## 2017-02-13 DIAGNOSIS — R011 Cardiac murmur, unspecified: Secondary | ICD-10-CM | POA: Diagnosis present

## 2017-02-13 DIAGNOSIS — R0902 Hypoxemia: Secondary | ICD-10-CM | POA: Diagnosis not present

## 2017-02-13 DIAGNOSIS — F17290 Nicotine dependence, other tobacco product, uncomplicated: Secondary | ICD-10-CM | POA: Diagnosis not present

## 2017-02-13 DIAGNOSIS — Z4682 Encounter for fitting and adjustment of non-vascular catheter: Secondary | ICD-10-CM | POA: Diagnosis not present

## 2017-02-13 DIAGNOSIS — Z8679 Personal history of other diseases of the circulatory system: Secondary | ICD-10-CM

## 2017-02-13 DIAGNOSIS — I998 Other disorder of circulatory system: Secondary | ICD-10-CM | POA: Diagnosis present

## 2017-02-13 DIAGNOSIS — Z419 Encounter for procedure for purposes other than remedying health state, unspecified: Secondary | ICD-10-CM

## 2017-02-13 DIAGNOSIS — I714 Abdominal aortic aneurysm, without rupture: Secondary | ICD-10-CM | POA: Diagnosis not present

## 2017-02-13 HISTORY — PX: ABDOMINAL AORTIC ANEURYSM REPAIR: SHX42

## 2017-02-13 HISTORY — PX: FEMORAL-FEMORAL BYPASS GRAFT: SHX936

## 2017-02-13 HISTORY — PX: LEFT HEART CATH AND CORONARY ANGIOGRAPHY: CATH118249

## 2017-02-13 HISTORY — PX: FEMORAL ARTERY EXPLORATION: SHX5160

## 2017-02-13 LAB — POCT I-STAT 7, (LYTES, BLD GAS, ICA,H+H)
ACID-BASE DEFICIT: 12 mmol/L — AB (ref 0.0–2.0)
Acid-base deficit: 7 mmol/L — ABNORMAL HIGH (ref 0.0–2.0)
Acid-base deficit: 13 mmol/L — ABNORMAL HIGH (ref 0.0–2.0)
Acid-base deficit: 21 mmol/L — ABNORMAL HIGH (ref 0.0–2.0)
Acid-base deficit: 18 mmol/L — ABNORMAL HIGH (ref 0.0–2.0)
Acid-base deficit: 14 mmol/L — ABNORMAL HIGH (ref 0.0–2.0)
Acid-base deficit: 13 mmol/L — ABNORMAL HIGH (ref 0.0–2.0)
BICARBONATE: 12.3 mmol/L — AB (ref 20.0–28.0)
BICARBONATE: 15.7 mmol/L — AB (ref 20.0–28.0)
Bicarbonate: 14.4 mmol/L — ABNORMAL LOW (ref 20.0–28.0)
Bicarbonate: 20.1 mmol/L (ref 20.0–28.0)
Bicarbonate: 10.7 mmol/L — ABNORMAL LOW (ref 20.0–28.0)
Bicarbonate: 15.6 mmol/L — ABNORMAL LOW (ref 20.0–28.0)
Bicarbonate: 14.6 mmol/L — ABNORMAL LOW (ref 20.0–28.0)
CALCIUM ION: 0.99 mmol/L — AB (ref 1.15–1.40)
Calcium, Ion: 1.16 mmol/L (ref 1.15–1.40)
Calcium, Ion: 1.11 mmol/L — ABNORMAL LOW (ref 1.15–1.40)
Calcium, Ion: 1.19 mmol/L (ref 1.15–1.40)
Calcium, Ion: 1.07 mmol/L — ABNORMAL LOW (ref 1.15–1.40)
Calcium, Ion: 1.15 mmol/L (ref 1.15–1.40)
Calcium, Ion: 1.11 mmol/L — ABNORMAL LOW (ref 1.15–1.40)
HCT: 23 % — ABNORMAL LOW (ref 39.0–52.0)
HCT: 19 % — ABNORMAL LOW (ref 39.0–52.0)
HCT: 24 % — ABNORMAL LOW (ref 39.0–52.0)
HEMATOCRIT: 22 % — AB (ref 39.0–52.0)
HEMATOCRIT: 18 % — AB (ref 39.0–52.0)
HEMATOCRIT: 30 % — AB (ref 39.0–52.0)
HEMATOCRIT: 31 % — AB (ref 39.0–52.0)
HEMOGLOBIN: 7.5 g/dL — AB (ref 13.0–17.0)
HEMOGLOBIN: 7.8 g/dL — AB (ref 13.0–17.0)
HEMOGLOBIN: 6.1 g/dL — AB (ref 13.0–17.0)
HEMOGLOBIN: 6.5 g/dL — AB (ref 13.0–17.0)
HEMOGLOBIN: 10.2 g/dL — AB (ref 13.0–17.0)
Hemoglobin: 8.2 g/dL — ABNORMAL LOW (ref 13.0–17.0)
Hemoglobin: 10.5 g/dL — ABNORMAL LOW (ref 13.0–17.0)
O2 SAT: 100 %
O2 SAT: 99 %
O2 SAT: 87 %
O2 SAT: 93 %
O2 SAT: 89 %
O2 SAT: 92 %
O2 Saturation: 89 %
PCO2 ART: 45.1 mmHg (ref 32.0–48.0)
PCO2 ART: 45.8 mmHg (ref 32.0–48.0)
PH ART: 7.257 — AB (ref 7.350–7.450)
PH ART: 6.947 — AB (ref 7.350–7.450)
PH ART: 7.135 — AB (ref 7.350–7.450)
PH ART: 7.019 — AB (ref 7.350–7.450)
PH ART: 7.184 — AB (ref 7.350–7.450)
PO2 ART: 172 mmHg — AB (ref 83.0–108.0)
PO2 ART: 292 mmHg — AB (ref 83.0–108.0)
PO2 ART: 68 mmHg — AB (ref 83.0–108.0)
PO2 ART: 82 mmHg — AB (ref 83.0–108.0)
POTASSIUM: 3.8 mmol/L (ref 3.5–5.1)
POTASSIUM: 3.1 mmol/L — AB (ref 3.5–5.1)
POTASSIUM: 3.7 mmol/L (ref 3.5–5.1)
POTASSIUM: 4.4 mmol/L (ref 3.5–5.1)
Patient temperature: 33.1
Patient temperature: 33
Potassium: 4.1 mmol/L (ref 3.5–5.1)
Potassium: 3.9 mmol/L (ref 3.5–5.1)
Potassium: 4.4 mmol/L (ref 3.5–5.1)
SODIUM: 141 mmol/L (ref 135–145)
SODIUM: 147 mmol/L — AB (ref 135–145)
SODIUM: 146 mmol/L — AB (ref 135–145)
Sodium: 139 mmol/L (ref 135–145)
Sodium: 142 mmol/L (ref 135–145)
Sodium: 145 mmol/L (ref 135–145)
Sodium: 146 mmol/L — ABNORMAL HIGH (ref 135–145)
TCO2: 15 mmol/L — ABNORMAL LOW (ref 22–32)
TCO2: 22 mmol/L (ref 22–32)
TCO2: 12 mmol/L — AB (ref 22–32)
TCO2: 17 mmol/L — AB (ref 22–32)
TCO2: 14 mmol/L — AB (ref 22–32)
TCO2: 16 mmol/L — AB (ref 22–32)
TCO2: 17 mmol/L — AB (ref 22–32)
pCO2 arterial: 31.5 mmHg — ABNORMAL LOW (ref 32.0–48.0)
pCO2 arterial: 43.7 mmHg (ref 32.0–48.0)
pCO2 arterial: 47.8 mmHg (ref 32.0–48.0)
pCO2 arterial: 36.2 mmHg (ref 32.0–48.0)
pCO2 arterial: 39.4 mmHg (ref 32.0–48.0)
pH, Arterial: 7.247 — ABNORMAL LOW (ref 7.350–7.450)
pH, Arterial: 7.191 — CL (ref 7.350–7.450)
pO2, Arterial: 72 mmHg — ABNORMAL LOW (ref 83.0–108.0)
pO2, Arterial: 56 mmHg — ABNORMAL LOW (ref 83.0–108.0)
pO2, Arterial: 65 mmHg — ABNORMAL LOW (ref 83.0–108.0)

## 2017-02-13 LAB — CBC
HEMATOCRIT: 32.6 % — AB (ref 39.0–52.0)
Hemoglobin: 10.7 g/dL — ABNORMAL LOW (ref 13.0–17.0)
MCH: 28.3 pg (ref 26.0–34.0)
MCHC: 32.8 g/dL (ref 30.0–36.0)
MCV: 86.2 fL (ref 78.0–100.0)
PLATELETS: 78 10*3/uL — AB (ref 150–400)
RBC: 3.78 MIL/uL — ABNORMAL LOW (ref 4.22–5.81)
RDW: 15.4 % (ref 11.5–15.5)
WBC: 12.1 10*3/uL — AB (ref 4.0–10.5)

## 2017-02-13 LAB — POCT I-STAT 3, ART BLOOD GAS (G3+)
ACID-BASE DEFICIT: 11 mmol/L — AB (ref 0.0–2.0)
Acid-base deficit: 8 mmol/L — ABNORMAL HIGH (ref 0.0–2.0)
BICARBONATE: 19.1 mmol/L — AB (ref 20.0–28.0)
Bicarbonate: 18.5 mmol/L — ABNORMAL LOW (ref 20.0–28.0)
O2 SAT: 89 %
O2 Saturation: 99 %
PCO2 ART: 51.1 mmHg — AB (ref 32.0–48.0)
PCO2 ART: 40.9 mmHg (ref 32.0–48.0)
Patient temperature: 34.2
Patient temperature: 35.1
TCO2: 20 mmol/L — AB (ref 22–32)
TCO2: 20 mmol/L — AB (ref 22–32)
pH, Arterial: 7.15 — CL (ref 7.350–7.450)
pH, Arterial: 7.266 — ABNORMAL LOW (ref 7.350–7.450)
pO2, Arterial: 63 mmHg — ABNORMAL LOW (ref 83.0–108.0)
pO2, Arterial: 140 mmHg — ABNORMAL HIGH (ref 83.0–108.0)

## 2017-02-13 LAB — COMPREHENSIVE METABOLIC PANEL
ALBUMIN: 3 g/dL — AB (ref 3.5–5.0)
ALK PHOS: 35 U/L — AB (ref 38–126)
ALT: 21 U/L (ref 17–63)
ALT: 76 U/L — AB (ref 17–63)
AST: 23 U/L (ref 15–41)
AST: 106 U/L — AB (ref 15–41)
Albumin: 3 g/dL — ABNORMAL LOW (ref 3.5–5.0)
Alkaline Phosphatase: 56 U/L (ref 38–126)
Anion gap: 9 (ref 5–15)
Anion gap: 17 — ABNORMAL HIGH (ref 5–15)
BILIRUBIN TOTAL: 0.8 mg/dL (ref 0.3–1.2)
BUN: 21 mg/dL — ABNORMAL HIGH (ref 6–20)
BUN: 22 mg/dL — AB (ref 6–20)
CALCIUM: 7.6 mg/dL — AB (ref 8.9–10.3)
CO2: 23 mmol/L (ref 22–32)
CO2: 19 mmol/L — AB (ref 22–32)
CREATININE: 2.15 mg/dL — AB (ref 0.61–1.24)
Calcium: 8.6 mg/dL — ABNORMAL LOW (ref 8.9–10.3)
Chloride: 107 mmol/L (ref 101–111)
Chloride: 108 mmol/L (ref 101–111)
Creatinine, Ser: 1.67 mg/dL — ABNORMAL HIGH (ref 0.61–1.24)
GFR calc Af Amer: 47 mL/min — ABNORMAL LOW (ref >60)
GFR calc Af Amer: 35 mL/min — ABNORMAL LOW (ref >60)
GFR calc non Af Amer: 41 mL/min — ABNORMAL LOW (ref >60)
GFR calc non Af Amer: 30 mL/min — ABNORMAL LOW (ref >60)
GLUCOSE: 347 mg/dL — AB (ref 65–99)
Glucose, Bld: 278 mg/dL — ABNORMAL HIGH (ref 65–99)
Potassium: 3.3 mmol/L — ABNORMAL LOW (ref 3.5–5.1)
Potassium: 4.6 mmol/L (ref 3.5–5.1)
SODIUM: 144 mmol/L (ref 135–145)
Sodium: 139 mmol/L (ref 135–145)
TOTAL PROTEIN: 4.2 g/dL — AB (ref 6.5–8.1)
Total Bilirubin: 0.9 mg/dL (ref 0.3–1.2)
Total Protein: 5.3 g/dL — ABNORMAL LOW (ref 6.5–8.1)

## 2017-02-13 LAB — MRSA PCR SCREENING: MRSA by PCR: NEGATIVE

## 2017-02-13 LAB — URINALYSIS, ROUTINE W REFLEX MICROSCOPIC
BILIRUBIN URINE: NEGATIVE
Glucose, UA: 150 mg/dL — AB
KETONES UR: NEGATIVE mg/dL
LEUKOCYTES UA: NEGATIVE
Nitrite: NEGATIVE
Protein, ur: NEGATIVE mg/dL
SPECIFIC GRAVITY, URINE: 1.015 (ref 1.005–1.030)
pH: 5 (ref 5.0–8.0)

## 2017-02-13 LAB — BPAM FFP
BLOOD PRODUCT EXPIRATION DATE: 201901142359
BLOOD PRODUCT EXPIRATION DATE: 201901142359
ISSUE DATE / TIME: 201901091419
ISSUE DATE / TIME: 201901091419
UNIT TYPE AND RH: 8400
Unit Type and Rh: 8400

## 2017-02-13 LAB — PREPARE FRESH FROZEN PLASMA
UNIT DIVISION: 0
Unit division: 0

## 2017-02-13 LAB — LACTIC ACID, PLASMA: Lactic Acid, Venous: 4.5 mmol/L (ref 0.5–1.9)

## 2017-02-13 LAB — PREPARE RBC (CROSSMATCH)

## 2017-02-13 LAB — BASIC METABOLIC PANEL
Anion gap: 16 — ABNORMAL HIGH (ref 5–15)
BUN: 22 mg/dL — AB (ref 6–20)
CALCIUM: 7.6 mg/dL — AB (ref 8.9–10.3)
CHLORIDE: 108 mmol/L (ref 101–111)
CO2: 20 mmol/L — AB (ref 22–32)
CREATININE: 2.14 mg/dL — AB (ref 0.61–1.24)
GFR calc non Af Amer: 30 mL/min — ABNORMAL LOW (ref >60)
GFR, EST AFRICAN AMERICAN: 35 mL/min — AB (ref >60)
GLUCOSE: 346 mg/dL — AB (ref 65–99)
Potassium: 4.6 mmol/L (ref 3.5–5.1)
Sodium: 144 mmol/L (ref 135–145)

## 2017-02-13 LAB — HEMOGLOBIN AND HEMATOCRIT, BLOOD
HCT: 25.1 % — ABNORMAL LOW (ref 39.0–52.0)
Hemoglobin: 8.3 g/dL — ABNORMAL LOW (ref 13.0–17.0)

## 2017-02-13 LAB — CBC WITH DIFFERENTIAL/PLATELET
Basophils Absolute: 0 10*3/uL (ref 0.0–0.1)
Basophils Relative: 0 %
Eosinophils Absolute: 0.3 10*3/uL (ref 0.0–0.7)
Eosinophils Relative: 2 %
HCT: 37.2 % — ABNORMAL LOW (ref 39.0–52.0)
Hemoglobin: 12 g/dL — ABNORMAL LOW (ref 13.0–17.0)
Lymphocytes Relative: 23 %
Lymphs Abs: 4.2 10*3/uL — ABNORMAL HIGH (ref 0.7–4.0)
MCH: 28.9 pg (ref 26.0–34.0)
MCHC: 32.3 g/dL (ref 30.0–36.0)
MCV: 89.6 fL (ref 78.0–100.0)
Monocytes Absolute: 1.1 10*3/uL — ABNORMAL HIGH (ref 0.1–1.0)
Monocytes Relative: 6 %
Neutro Abs: 12.7 10*3/uL — ABNORMAL HIGH (ref 1.7–7.7)
Neutrophils Relative %: 69 %
Platelets: 249 10*3/uL (ref 150–400)
RBC: 4.15 MIL/uL — ABNORMAL LOW (ref 4.22–5.81)
RDW: 13.4 % (ref 11.5–15.5)
WBC: 18.2 10*3/uL — ABNORMAL HIGH (ref 4.0–10.5)

## 2017-02-13 LAB — APTT
aPTT: 27 seconds (ref 24–36)
aPTT: >200 seconds (ref 24–36)
aPTT: 50 seconds — ABNORMAL HIGH (ref 24–36)

## 2017-02-13 LAB — POCT I-STAT, CHEM 8
BUN: 23 mg/dL — AB (ref 6–20)
CREATININE: 1.7 mg/dL — AB (ref 0.61–1.24)
Calcium, Ion: 1.1 mmol/L — ABNORMAL LOW (ref 1.15–1.40)
Chloride: 107 mmol/L (ref 101–111)
Glucose, Bld: 333 mg/dL — ABNORMAL HIGH (ref 65–99)
HEMATOCRIT: 30 % — AB (ref 39.0–52.0)
Hemoglobin: 10.2 g/dL — ABNORMAL LOW (ref 13.0–17.0)
POTASSIUM: 4.6 mmol/L (ref 3.5–5.1)
Sodium: 145 mmol/L (ref 135–145)
TCO2: 20 mmol/L — AB (ref 22–32)

## 2017-02-13 LAB — LIPASE, BLOOD: Lipase: 29 U/L (ref 11–51)

## 2017-02-13 LAB — PROTIME-INR
INR: 1.22
INR: 3.84
INR: 2.05
PROTHROMBIN TIME: 37.5 s — AB (ref 11.4–15.2)
Prothrombin Time: 15.3 seconds — ABNORMAL HIGH (ref 11.4–15.2)
Prothrombin Time: 23 seconds — ABNORMAL HIGH (ref 11.4–15.2)

## 2017-02-13 LAB — ABO/RH: ABO/RH(D): AB POS

## 2017-02-13 LAB — POCT ACTIVATED CLOTTING TIME
Activated Clotting Time: 197 seconds
Activated Clotting Time: 279 seconds

## 2017-02-13 LAB — PLATELET COUNT: PLATELETS: 124 10*3/uL — AB (ref 150–400)

## 2017-02-13 LAB — FIBRINOGEN
FIBRINOGEN: 101 mg/dL — AB (ref 210–475)
Fibrinogen: 151 mg/dL — ABNORMAL LOW (ref 210–475)

## 2017-02-13 LAB — GLUCOSE, CAPILLARY: Glucose-Capillary: 344 mg/dL — ABNORMAL HIGH (ref 65–99)

## 2017-02-13 LAB — MAGNESIUM: Magnesium: 2 mg/dL (ref 1.7–2.4)

## 2017-02-13 SURGERY — ANEURYSM ABDOMINAL AORTIC REPAIR
Anesthesia: General | Site: Groin

## 2017-02-13 SURGERY — LEFT HEART CATH AND CORONARY ANGIOGRAPHY
Anesthesia: LOCAL

## 2017-02-13 MED ORDER — SODIUM BICARBONATE 8.4 % IV SOLN
INTRAVENOUS | Status: AC
  Filled 2017-02-13: qty 50

## 2017-02-13 MED ORDER — ALUM & MAG HYDROXIDE-SIMETH 200-200-20 MG/5ML PO SUSP: 15.0000 mL | ORAL | Status: DC | PRN

## 2017-02-13 MED ORDER — AMIODARONE HCL IN DEXTROSE 360-4.14 MG/200ML-% IV SOLN
30.0000 mg/h | INTRAVENOUS | Status: DC
  Administered 2017-02-14 (×2): 30 mg/h via INTRAVENOUS
  Filled 2017-02-13: qty 200

## 2017-02-13 MED ORDER — ALBUTEROL SULFATE (2.5 MG/3ML) 0.083% IN NEBU: 2.5000 mg | INHALATION_SOLUTION | RESPIRATORY_TRACT | Status: DC | PRN

## 2017-02-13 MED ORDER — CALCIUM CHLORIDE 10 % IV SOLN
INTRAVENOUS | Status: DC | PRN
  Administered 2017-02-13 (×2): 0.5 g via INTRAVENOUS

## 2017-02-13 MED ORDER — PHENYLEPHRINE 40 MCG/ML (10ML) SYRINGE FOR IV PUSH (FOR BLOOD PRESSURE SUPPORT)
PREFILLED_SYRINGE | INTRAVENOUS | Status: DC | PRN
  Administered 2017-02-13 (×2): 200 ug via INTRAVENOUS
  Administered 2017-02-13: 120 ug via INTRAVENOUS
  Administered 2017-02-13: 80 ug via INTRAVENOUS
  Administered 2017-02-13 (×2): 200 ug via INTRAVENOUS
  Administered 2017-02-13: 160 ug via INTRAVENOUS
  Administered 2017-02-13: 240 ug via INTRAVENOUS
  Administered 2017-02-13: 200 ug via INTRAVENOUS
  Administered 2017-02-13: 120 ug via INTRAVENOUS
  Administered 2017-02-13: 200 ug via INTRAVENOUS
  Administered 2017-02-13: 80 ug via INTRAVENOUS

## 2017-02-13 MED ORDER — LACTATED RINGERS IV SOLN
INTRAVENOUS | Status: DC | PRN
  Administered 2017-02-13 (×4): via INTRAVENOUS

## 2017-02-13 MED ORDER — ROCURONIUM BROMIDE 100 MG/10ML IV SOLN
INTRAVENOUS | Status: DC | PRN
  Administered 2017-02-13 (×4): 50 mg via INTRAVENOUS
  Administered 2017-02-13: 20 mg via INTRAVENOUS
  Administered 2017-02-13: 30 mg via INTRAVENOUS
  Administered 2017-02-13 (×2): 50 mg via INTRAVENOUS

## 2017-02-13 MED ORDER — LIDOCAINE 2% (20 MG/ML) 5 ML SYRINGE
INTRAMUSCULAR | Status: AC
  Filled 2017-02-13: qty 5

## 2017-02-13 MED ORDER — HEPARIN SODIUM (PORCINE) 1000 UNIT/ML IJ SOLN
INTRAMUSCULAR | Status: AC
  Filled 2017-02-13: qty 1

## 2017-02-13 MED ORDER — SODIUM CHLORIDE 0.9 % IV SOLN: Freq: Once | INTRAVENOUS | Status: DC

## 2017-02-13 MED ORDER — FENTANYL CITRATE (PF) 100 MCG/2ML IJ SOLN
50.0000 ug | INTRAMUSCULAR | Status: DC | PRN
  Administered 2017-02-14 (×2): 50 ug via INTRAVENOUS
  Filled 2017-02-13 (×2): qty 2

## 2017-02-13 MED ORDER — DEXMEDETOMIDINE HCL IN NACL 400 MCG/100ML IV SOLN: 0.4000 ug/kg/h | INTRAVENOUS | Status: DC

## 2017-02-13 MED ORDER — SUCCINYLCHOLINE CHLORIDE 20 MG/ML IJ SOLN
INTRAMUSCULAR | Status: DC | PRN
  Administered 2017-02-13: 120 mg via INTRAVENOUS

## 2017-02-13 MED ORDER — ONDANSETRON HCL 4 MG/2ML IJ SOLN: 4.0000 mg | Freq: Four times a day (QID) | INTRAMUSCULAR | Status: DC | PRN

## 2017-02-13 MED ORDER — SODIUM BICARBONATE 8.4 % IV SOLN
INTRAVENOUS | Status: DC | PRN
  Administered 2017-02-13 (×8): 50 meq via INTRAVENOUS

## 2017-02-13 MED ORDER — ACETAMINOPHEN 325 MG RE SUPP: 325.0000 mg | RECTAL | Status: DC | PRN

## 2017-02-13 MED ORDER — MIDAZOLAM HCL 5 MG/5ML IJ SOLN
INTRAMUSCULAR | Status: DC | PRN
  Administered 2017-02-13: .5 mg via INTRAVENOUS

## 2017-02-13 MED ORDER — SODIUM CHLORIDE 0.9 % IJ SOLN
INTRAVENOUS | Status: DC | PRN
  Administered 2017-02-13: 10 mL via INTRAMUSCULAR

## 2017-02-13 MED ORDER — 0.9 % SODIUM CHLORIDE (POUR BTL) OPTIME
TOPICAL | Status: DC | PRN
  Administered 2017-02-13: 3000 mL

## 2017-02-13 MED ORDER — HEPARIN SODIUM (PORCINE) 1000 UNIT/ML IJ SOLN
INTRAMUSCULAR | Status: DC | PRN
  Administered 2017-02-13: 5000 [IU] via INTRAVENOUS
  Administered 2017-02-13: 2000 [IU] via INTRAVENOUS
  Administered 2017-02-13: 3000 [IU] via INTRAVENOUS
  Administered 2017-02-13: 5000 [IU] via INTRAVENOUS

## 2017-02-13 MED ORDER — DEXTROSE-NACL 5-0.45 % IV SOLN: INTRAVENOUS | Status: DC

## 2017-02-13 MED ORDER — FENTANYL CITRATE (PF) 250 MCG/5ML IJ SOLN
INTRAMUSCULAR | Status: AC
  Filled 2017-02-13: qty 5

## 2017-02-13 MED ORDER — LIDOCAINE HCL (CARDIAC) 20 MG/ML IV SOLN
INTRAVENOUS | Status: DC | PRN
  Administered 2017-02-13: 100 mg via INTRAVENOUS

## 2017-02-13 MED ORDER — FENTANYL CITRATE (PF) 100 MCG/2ML IJ SOLN: INTRAMUSCULAR | Status: DC | PRN

## 2017-02-13 MED ORDER — DEXMEDETOMIDINE HCL IN NACL 200 MCG/50ML IV SOLN
INTRAVENOUS | Status: AC
  Filled 2017-02-13: qty 50

## 2017-02-13 MED ORDER — ACETAMINOPHEN 325 MG PO TABS: 325.0000 mg | ORAL_TABLET | ORAL | Status: DC | PRN

## 2017-02-13 MED ORDER — ETOMIDATE 2 MG/ML IV SOLN
INTRAVENOUS | Status: DC | PRN
  Administered 2017-02-13: 20 mg via INTRAVENOUS

## 2017-02-13 MED ORDER — HEMOSTATIC AGENTS (NO CHARGE) OPTIME
TOPICAL | Status: DC | PRN
  Administered 2017-02-13 (×2): 1 via TOPICAL

## 2017-02-13 MED ORDER — GUAIFENESIN-DM 100-10 MG/5ML PO SYRP: 15.0000 mL | ORAL_SOLUTION | ORAL | Status: DC | PRN

## 2017-02-13 MED ORDER — DEXAMETHASONE SODIUM PHOSPHATE 10 MG/ML IJ SOLN
INTRAMUSCULAR | Status: AC
  Filled 2017-02-13: qty 1

## 2017-02-13 MED ORDER — AMIODARONE IV BOLUS ONLY 150 MG/100ML
INTRAVENOUS | Status: DC | PRN
  Administered 2017-02-13: 150 mg via INTRAVENOUS

## 2017-02-13 MED ORDER — ONDANSETRON HCL 4 MG/2ML IJ SOLN
INTRAMUSCULAR | Status: AC
  Filled 2017-02-13: qty 2

## 2017-02-13 MED ORDER — IOPAMIDOL (ISOVUE-370) INJECTION 76%
INTRAVENOUS | Status: AC
  Administered 2017-02-13: 100 mL
  Filled 2017-02-13: qty 100

## 2017-02-13 MED ORDER — SODIUM CHLORIDE 0.9 % IV SOLN
0.0000 ug/min | INTRAVENOUS | Status: DC
  Administered 2017-02-13: 40 ug/min via INTRAVENOUS
  Filled 2017-02-13 (×2): qty 1

## 2017-02-13 MED ORDER — PROPOFOL 10 MG/ML IV BOLUS
INTRAVENOUS | Status: AC
  Filled 2017-02-13: qty 20

## 2017-02-13 MED ORDER — MAGNESIUM SULFATE 2 GM/50ML IV SOLN: 2.0000 g | Freq: Once | INTRAVENOUS | Status: DC | PRN

## 2017-02-13 MED ORDER — CEFUROXIME SODIUM 1.5 G IV SOLR
1.5000 g | INTRAVENOUS | Status: DC
  Filled 2017-02-13: qty 1.5

## 2017-02-13 MED ORDER — MIDAZOLAM HCL 2 MG/2ML IJ SOLN
INTRAMUSCULAR | Status: AC
  Filled 2017-02-13: qty 2

## 2017-02-13 MED ORDER — FUROSEMIDE 10 MG/ML IJ SOLN
20.0000 mg | INTRAMUSCULAR | Status: AC
  Administered 2017-02-13 (×2): 20 mg via INTRAVENOUS
  Filled 2017-02-13: qty 4

## 2017-02-13 MED ORDER — MORPHINE SULFATE (PF) 4 MG/ML IV SOLN: 2.0000 mg | INTRAVENOUS | Status: DC | PRN

## 2017-02-13 MED ORDER — ROCURONIUM BROMIDE 10 MG/ML (PF) SYRINGE
PREFILLED_SYRINGE | INTRAVENOUS | Status: AC
  Filled 2017-02-13: qty 5

## 2017-02-13 MED ORDER — PHENYLEPHRINE 40 MCG/ML (10ML) SYRINGE FOR IV PUSH (FOR BLOOD PRESSURE SUPPORT)
PREFILLED_SYRINGE | INTRAVENOUS | Status: AC
  Filled 2017-02-13: qty 10

## 2017-02-13 MED ORDER — VASOPRESSIN 20 UNIT/ML IV SOLN
1.0000 [IU] | INTRAVENOUS | Status: DC
  Filled 2017-02-13: qty 0.5

## 2017-02-13 MED ORDER — NOREPINEPHRINE BITARTRATE 1 MG/ML IV SOLN
0.0000 ug/min | INTRAVENOUS | Status: AC
  Administered 2017-02-13: 2 ug/min via INTRAVENOUS
  Filled 2017-02-13: qty 16

## 2017-02-13 MED ORDER — ETOMIDATE 2 MG/ML IV SOLN
INTRAVENOUS | Status: AC
  Filled 2017-02-13: qty 10

## 2017-02-13 MED ORDER — FENTANYL CITRATE (PF) 250 MCG/5ML IJ SOLN
INTRAMUSCULAR | Status: DC | PRN
  Administered 2017-02-13: 50 ug via INTRAVENOUS
  Administered 2017-02-13: 100 ug via INTRAVENOUS
  Administered 2017-02-13 (×3): 50 ug via INTRAVENOUS

## 2017-02-13 MED ORDER — LIDOCAINE HCL 1 % IJ SOLN
INTRAMUSCULAR | Status: DC | PRN
Start: 2017-02-13 — End: 2017-02-13
  Administered 2017-02-13: 15 mL via INTRADERMAL

## 2017-02-13 MED ORDER — METOPROLOL TARTRATE 5 MG/5ML IV SOLN: 2.0000 mg | INTRAVENOUS | Status: DC | PRN

## 2017-02-13 MED ORDER — LACTATED RINGERS IV SOLN: INTRAVENOUS | Status: DC

## 2017-02-13 MED ORDER — MIDAZOLAM HCL 2 MG/2ML IJ SOLN
2.0000 mg | INTRAMUSCULAR | Status: DC | PRN
  Administered 2017-02-14: 2 mg via INTRAVENOUS
  Filled 2017-02-13: qty 2

## 2017-02-13 MED ORDER — DEXTROSE 5 % IV SOLN
1.5000 g | INTRAVENOUS | Status: AC
  Administered 2017-02-13: 1.5 g via INTRAVENOUS
  Filled 2017-02-13: qty 1.5

## 2017-02-13 MED ORDER — EPHEDRINE SULFATE-NACL 50-0.9 MG/10ML-% IV SOSY
PREFILLED_SYRINGE | INTRAVENOUS | Status: DC | PRN
Start: 2017-02-13 — End: 2017-02-13
  Administered 2017-02-13: 25 mg via INTRAVENOUS

## 2017-02-13 MED ORDER — HYDRALAZINE HCL 20 MG/ML IJ SOLN
5.0000 mg | INTRAMUSCULAR | Status: DC | PRN
  Filled 2017-02-13: qty 1

## 2017-02-13 MED ORDER — ALBUMIN HUMAN 5 % IV SOLN
INTRAVENOUS | Status: DC | PRN
  Administered 2017-02-13 (×5): via INTRAVENOUS

## 2017-02-13 MED ORDER — MIDAZOLAM HCL 2 MG/2ML IJ SOLN: 2.0000 mg | INTRAMUSCULAR | Status: DC | PRN

## 2017-02-13 MED ORDER — CHLORHEXIDINE GLUCONATE 0.12% ORAL RINSE (MEDLINE KIT)
15.0000 mL | Freq: Two times a day (BID) | OROMUCOSAL | Status: DC
  Administered 2017-02-13 – 2017-02-14 (×2): 15 mL via OROMUCOSAL

## 2017-02-13 MED ORDER — LIDOCAINE-EPINEPHRINE 1 %-1:100000 IJ SOLN
INTRAMUSCULAR | Status: AC
  Filled 2017-02-13: qty 1

## 2017-02-13 MED ORDER — PANTOPRAZOLE SODIUM 40 MG IV SOLR
40.0000 mg | Freq: Every day | INTRAVENOUS | Status: DC
  Administered 2017-02-13: 40 mg via INTRAVENOUS
  Filled 2017-02-13: qty 40

## 2017-02-13 MED ORDER — DEXTROSE 5 % IV SOLN
1.5000 g | Freq: Two times a day (BID) | INTRAVENOUS | Status: AC
  Administered 2017-02-13 – 2017-02-14 (×2): 1.5 g via INTRAVENOUS
  Filled 2017-02-13 (×2): qty 1.5

## 2017-02-13 MED ORDER — ORAL CARE MOUTH RINSE
15.0000 mL | Freq: Four times a day (QID) | OROMUCOSAL | Status: DC
  Administered 2017-02-14 (×2): 15 mL via OROMUCOSAL

## 2017-02-13 MED ORDER — INSULIN ASPART 100 UNIT/ML ~~LOC~~ SOLN
2.0000 [IU] | SUBCUTANEOUS | Status: DC
  Administered 2017-02-13 – 2017-02-14 (×3): 6 [IU] via SUBCUTANEOUS

## 2017-02-13 MED ORDER — LABETALOL HCL 5 MG/ML IV SOLN: 10.0000 mg | INTRAVENOUS | Status: DC | PRN

## 2017-02-13 MED ORDER — DEXTROSE 5 % IV SOLN
0.5000 ug/min | INTRAVENOUS | Status: DC
  Filled 2017-02-13: qty 4

## 2017-02-13 MED ORDER — SUCCINYLCHOLINE CHLORIDE 200 MG/10ML IV SOSY
PREFILLED_SYRINGE | INTRAVENOUS | Status: AC
  Filled 2017-02-13: qty 10

## 2017-02-13 MED ORDER — SODIUM CHLORIDE 0.9 % IV SOLN
INTRAVENOUS | Status: DC | PRN
  Administered 2017-02-13: 10:00:00 500 mL

## 2017-02-13 MED ORDER — EPINEPHRINE PF 1 MG/ML IJ SOLN
0.5000 ug/min | INTRAVENOUS | Status: DC
  Administered 2017-02-14: 1 ug/min via INTRAVENOUS
  Filled 2017-02-13 (×2): qty 4

## 2017-02-13 MED ORDER — SODIUM CHLORIDE 0.9 % IV BOLUS (SEPSIS): 2000.0000 mL | Freq: Once | INTRAVENOUS | Status: DC

## 2017-02-13 MED ORDER — DEXMEDETOMIDINE HCL IN NACL 200 MCG/50ML IV SOLN
INTRAVENOUS | Status: DC | PRN
  Administered 2017-02-13: 0.7 ug/kg/h via INTRAVENOUS

## 2017-02-13 MED ORDER — DOCUSATE SODIUM 100 MG PO CAPS: 100.0000 mg | ORAL_CAPSULE | Freq: Every day | ORAL | Status: DC

## 2017-02-13 MED ORDER — SODIUM CHLORIDE 0.9 % IV SOLN
INTRAVENOUS | Status: DC | PRN
  Administered 2017-02-13 (×2): via INTRAVENOUS

## 2017-02-13 MED ORDER — FENTANYL CITRATE (PF) 100 MCG/2ML IJ SOLN
50.0000 ug | INTRAMUSCULAR | Status: DC | PRN
  Filled 2017-02-13: qty 2

## 2017-02-13 MED ORDER — SODIUM CHLORIDE 0.9 % IV SOLN: 250.0000 mL | INTRAVENOUS | Status: DC | PRN

## 2017-02-13 MED ORDER — EPINEPHRINE PF 1 MG/10ML IJ SOSY
PREFILLED_SYRINGE | INTRAMUSCULAR | Status: DC | PRN
  Administered 2017-02-13: 1 mg via INTRAVENOUS
  Administered 2017-02-13: 0.3 mg via INTRAVENOUS
  Administered 2017-02-13 (×2): 1 mg via INTRAVENOUS

## 2017-02-13 MED ORDER — PROTAMINE SULFATE 10 MG/ML IV SOLN
INTRAVENOUS | Status: AC
  Filled 2017-02-13: qty 5

## 2017-02-13 MED ORDER — PHENYLEPHRINE HCL 10 MG/ML IJ SOLN
INTRAVENOUS | Status: DC | PRN
  Administered 2017-02-13: 50 ug/min via INTRAVENOUS

## 2017-02-13 MED ORDER — FUROSEMIDE 10 MG/ML IJ SOLN: 20.0000 mg | INTRAMUSCULAR | Status: DC

## 2017-02-13 MED ORDER — POTASSIUM CHLORIDE CRYS ER 20 MEQ PO TBCR
20.0000 meq | EXTENDED_RELEASE_TABLET | Freq: Once | ORAL | Status: DC | PRN
Start: 2017-02-13 — End: 2017-02-13

## 2017-02-13 MED ORDER — SODIUM CHLORIDE 0.9 % IV SOLN
INTRAVENOUS | Status: DC | PRN
  Administered 2017-02-13: 17:00:00 500 mL

## 2017-02-13 MED ORDER — PHENOL 1.4 % MT LIQD: 1.0000 | OROMUCOSAL | Status: DC | PRN

## 2017-02-13 MED ORDER — PROTAMINE SULFATE 10 MG/ML IV SOLN
INTRAVENOUS | Status: DC | PRN
  Administered 2017-02-13: 20 mg via INTRAVENOUS
  Administered 2017-02-13 (×3): 10 mg via INTRAVENOUS
  Administered 2017-02-13: 50 mg via INTRAVENOUS

## 2017-02-13 MED ORDER — PANTOPRAZOLE SODIUM 40 MG PO TBEC: 40.0000 mg | DELAYED_RELEASE_TABLET | Freq: Every day | ORAL | Status: DC

## 2017-02-13 MED ORDER — SODIUM CHLORIDE 0.9 % IV SOLN: 500.0000 mL | Freq: Once | INTRAVENOUS | Status: DC | PRN

## 2017-02-13 MED ORDER — LIDOCAINE HCL (PF) 1 % IJ SOLN
INTRAMUSCULAR | Status: AC
  Filled 2017-02-13: qty 30

## 2017-02-13 MED ORDER — DEXTROSE 5 % IV SOLN
INTRAVENOUS | Status: DC | PRN
  Administered 2017-02-13: 1.5 g via INTRAVENOUS

## 2017-02-13 MED ORDER — PHENYLEPHRINE 40 MCG/ML (10ML) SYRINGE FOR IV PUSH (FOR BLOOD PRESSURE SUPPORT)
PREFILLED_SYRINGE | INTRAVENOUS | Status: AC
  Filled 2017-02-13: qty 20

## 2017-02-13 MED ORDER — VASOPRESSIN 20 UNIT/ML IV SOLN
INTRAVENOUS | Status: DC | PRN
  Administered 2017-02-13: 7 [IU] via INTRAVENOUS

## 2017-02-13 MED ORDER — AMIODARONE HCL IN DEXTROSE 360-4.14 MG/200ML-% IV SOLN
60.0000 mg/h | INTRAVENOUS | Status: AC
  Administered 2017-02-13: 60 mg/h via INTRAVENOUS
  Filled 2017-02-13 (×3): qty 200

## 2017-02-13 MED ORDER — BISACODYL 10 MG RE SUPP: 10.0000 mg | Freq: Every day | RECTAL | Status: DC | PRN

## 2017-02-13 SURGICAL SUPPLY — 106 items
BAG DECANTER FOR FLEXI CONT (MISCELLANEOUS) ×7 IMPLANT
BAG SNAP BAND KOVER 36X36 (MISCELLANEOUS) ×7 IMPLANT
CANISTER SUCT 3000ML PPV (MISCELLANEOUS) ×7 IMPLANT
CATH ANGIO 5F BER2 65CM (CATHETERS) ×7 IMPLANT
CATH DIAG EXPO 6F FR4 (CATHETERS) ×14 IMPLANT
CATH EMB 3FR 80CM (CATHETERS) ×7 IMPLANT
CATH EMB 4FR 80CM (CATHETERS) ×14 IMPLANT
CATH EMB 5FR 80CM (CATHETERS) ×7 IMPLANT
CLIP VESOCCLUDE MED 24/CT (CLIP) ×7 IMPLANT
CLIP VESOCCLUDE SM WIDE 24/CT (CLIP) ×7 IMPLANT
COUNTER NEEDLE 20 DBL MAG RED (NEEDLE) ×14 IMPLANT
COVER BACK TABLE 80X110 HD (DRAPES) ×14 IMPLANT
COVER MAYO STAND STRL (DRAPES) ×7 IMPLANT
COVER SURGICAL LIGHT HANDLE (MISCELLANEOUS) ×21 IMPLANT
DERMABOND ADVANCED (GAUZE/BANDAGES/DRESSINGS)
DERMABOND ADVANCED .7 DNX12 (GAUZE/BANDAGES/DRESSINGS) IMPLANT
DEVICE CLOSURE PERCLS PRGLD 6F (VASCULAR PRODUCTS) ×10 IMPLANT
DRAPE HALF SHEET 40X57 (DRAPES) ×14 IMPLANT
DRAPE INCISE IOBAN 66X45 STRL (DRAPES) ×21 IMPLANT
DRAPE ORTHO SPLIT 77X108 STRL (DRAPES) ×4
DRAPE SURG ORHT 6 SPLT 77X108 (DRAPES) ×10 IMPLANT
DRSG COVADERM 4X10 (GAUZE/BANDAGES/DRESSINGS) ×7 IMPLANT
DRSG COVADERM 4X14 (GAUZE/BANDAGES/DRESSINGS) ×21 IMPLANT
DRSG COVADERM 4X6 (GAUZE/BANDAGES/DRESSINGS) ×35 IMPLANT
DRYSEAL FLEXSHEATH 12FR 33CM (SHEATH) ×2
ELECT BLADE 4.0 EZ CLEAN MEGAD (MISCELLANEOUS) ×14
ELECT BLADE 6.5 EXT (BLADE) ×7 IMPLANT
ELECT REM PT RETURN 9FT ADLT (ELECTROSURGICAL) ×7
ELECTRODE BLDE 4.0 EZ CLN MEGD (MISCELLANEOUS) ×10 IMPLANT
ELECTRODE REM PT RTRN 9FT ADLT (ELECTROSURGICAL) ×5 IMPLANT
FELT TEFLON 1X6 (MISCELLANEOUS) ×7 IMPLANT
GAUZE SPONGE 4X4 12PLY STRL (GAUZE/BANDAGES/DRESSINGS) ×7 IMPLANT
GLOVE BIO SURGEON STRL SZ 6.5 (GLOVE) ×18 IMPLANT
GLOVE BIO SURGEONS STRL SZ 6.5 (GLOVE) ×3
GLOVE BIOGEL PI IND STRL 6 (GLOVE) ×5 IMPLANT
GLOVE BIOGEL PI IND STRL 6.5 (GLOVE) ×25 IMPLANT
GLOVE BIOGEL PI IND STRL 7.0 (GLOVE) ×15 IMPLANT
GLOVE BIOGEL PI IND STRL 7.5 (GLOVE) ×25 IMPLANT
GLOVE BIOGEL PI INDICATOR 6 (GLOVE) ×2
GLOVE BIOGEL PI INDICATOR 6.5 (GLOVE) ×10
GLOVE BIOGEL PI INDICATOR 7.0 (GLOVE) ×6
GLOVE BIOGEL PI INDICATOR 7.5 (GLOVE) ×10
GLOVE SURG SS PI 6.5 STRL IVOR (GLOVE) ×49 IMPLANT
GLOVE SURG SS PI 7.5 STRL IVOR (GLOVE) ×49 IMPLANT
GOWN STRL NON-REIN LRG LVL3 (GOWN DISPOSABLE) ×21 IMPLANT
GOWN STRL REUS W/ TWL LRG LVL3 (GOWN DISPOSABLE) ×40 IMPLANT
GOWN STRL REUS W/ TWL XL LVL3 (GOWN DISPOSABLE) ×45 IMPLANT
GOWN STRL REUS W/TWL LRG LVL3 (GOWN DISPOSABLE) ×16
GOWN STRL REUS W/TWL XL LVL3 (GOWN DISPOSABLE) ×18
GRAFT BALLN CATH 65CM (STENTS) ×5 IMPLANT
GRAFT HEMASHIELD 18X9MM (Vascular Products) ×7 IMPLANT
GRAFT HEMASHIELD 20X10 (Vascular Products) IMPLANT
GRAFT HEMASHIELD 22X30M (Vascular Products) ×7 IMPLANT
GRAFT HEMASHIELD 8MM (Vascular Products) ×2 IMPLANT
GRAFT VASC STRG 30X8KNIT (Vascular Products) ×5 IMPLANT
GUIDEWIRE WHOLEY .035 145 JTIP (WIRE) ×7 IMPLANT
HEMOSTAT SNOW SURGICEL 2X4 (HEMOSTASIS) ×14 IMPLANT
INSERT FOGARTY 61MM (MISCELLANEOUS) ×14 IMPLANT
INSERT FOGARTY SM (MISCELLANEOUS) ×28 IMPLANT
KIT BASIN OR (CUSTOM PROCEDURE TRAY) ×7 IMPLANT
KIT ROOM TURNOVER OR (KITS) ×7 IMPLANT
NEEDLE PERC 18GX7CM (NEEDLE) ×7 IMPLANT
NS IRRIG 1000ML POUR BTL (IV SOLUTION) ×14 IMPLANT
PACK AORTA (CUSTOM PROCEDURE TRAY) ×7 IMPLANT
PACK UNIVERSAL I (CUSTOM PROCEDURE TRAY) ×7 IMPLANT
PAD ARMBOARD 7.5X6 YLW CONV (MISCELLANEOUS) ×14 IMPLANT
PENCIL BUTTON HOLSTER BLD 10FT (ELECTRODE) ×14 IMPLANT
PERCLOSE PROGLIDE 6F (VASCULAR PRODUCTS) ×14
SET BERKELEY SUCTION TUBING (SUCTIONS) ×7 IMPLANT
SHEATH DRYSEAL FLEX 12FR 33CM (SHEATH) ×5 IMPLANT
SHEATH FAST CATH 10F 12CM (SHEATH) ×7 IMPLANT
SHEATH PINNACLE 6F 10CM (SHEATH) ×14 IMPLANT
SPONGE INTESTINAL PEANUT (DISPOSABLE) ×14 IMPLANT
SPONGE LAP 18X18 X RAY DECT (DISPOSABLE) ×42 IMPLANT
STAPLER VISISTAT 35W (STAPLE) ×21 IMPLANT
STENT GRAFT BALLN CATH 65CM (STENTS) ×2
SUT ETHIBOND 5 LR DA (SUTURE) IMPLANT
SUT PDS AB 1 TP1 54 (SUTURE) ×28 IMPLANT
SUT PDS AB 1 TP1 96 (SUTURE) ×14 IMPLANT
SUT PROLENE 3 0 SH 48 (SUTURE) ×21 IMPLANT
SUT PROLENE 3 0 SH1 36 (SUTURE) ×28 IMPLANT
SUT PROLENE 4 0 RB 1 (SUTURE) ×4
SUT PROLENE 4-0 RB1 .5 CRCL 36 (SUTURE) ×10 IMPLANT
SUT PROLENE 5 0 C 1 24 (SUTURE) ×35 IMPLANT
SUT PROLENE 5 0 C 1 36 (SUTURE) ×84 IMPLANT
SUT SILK 0 TIES 10X30 (SUTURE) ×7 IMPLANT
SUT SILK 2 0SH CR/8 30 (SUTURE) ×7 IMPLANT
SUT VIC AB 2-0 CT1 27 (SUTURE) ×14
SUT VIC AB 2-0 CT1 TAPERPNT 27 (SUTURE) ×35 IMPLANT
SUT VIC AB 3-0 SH 27 (SUTURE) ×10
SUT VIC AB 3-0 SH 27X BRD (SUTURE) ×25 IMPLANT
SUT VIC AB 4-0 PS2 27 (SUTURE) ×35 IMPLANT
SUT VICRYL 4-0 PS2 18IN ABS (SUTURE) ×14 IMPLANT
SYR 3ML LL SCALE MARK (SYRINGE) ×7 IMPLANT
SYR BULB IRRIGATION 50ML (SYRINGE) ×7 IMPLANT
SYR CONTROL 10ML LL (SYRINGE) ×7 IMPLANT
SYR MEDRAD MARK V 150ML (SYRINGE) ×7 IMPLANT
SYRINGE 3CC LL L/F (MISCELLANEOUS) ×7 IMPLANT
TAPE UMBILICAL COTTON 1/8X30 (MISCELLANEOUS) ×7 IMPLANT
TOWEL BLUE STERILE X RAY DET (MISCELLANEOUS) ×21 IMPLANT
TOWEL GREEN STERILE (TOWEL DISPOSABLE) ×7 IMPLANT
TRAY FOLEY W/METER SILVER 16FR (SET/KITS/TRAYS/PACK) ×7 IMPLANT
WATER STERILE IRR 1000ML POUR (IV SOLUTION) ×14 IMPLANT
WIRE AMPLATZ SS-J .035X180CM (WIRE) ×7 IMPLANT
WIRE COONS/BENSON .038X145CM (WIRE) ×14 IMPLANT
YANKAUER SUCT BULB TIP NO VENT (SUCTIONS) ×7 IMPLANT

## 2017-02-13 SURGICAL SUPPLY — 12 items
CATH VISTA GUIDE 6FR XBLAD3.5 (CATHETERS) ×2 IMPLANT
GLIDESHEATH SLEND SS 6F .021 (SHEATH) ×2 IMPLANT
GUIDEWIRE INQWIRE 1.5J.035X260 (WIRE) ×1 IMPLANT
INQWIRE 1.5J .035X260CM (WIRE) ×2
KIT ENCORE 26 ADVANTAGE (KITS) ×2 IMPLANT
KIT HEART LEFT (KITS) ×2 IMPLANT
PACK CARDIAC CATHETERIZATION (CUSTOM PROCEDURE TRAY) ×2 IMPLANT
SHEATH PINNACLE 6F 10CM (SHEATH) ×4 IMPLANT
SYR MEDRAD MARK V 150ML (SYRINGE) ×2 IMPLANT
TRANSDUCER W/STOPCOCK (MISCELLANEOUS) ×4 IMPLANT
TUBING CIL FLEX 10 FLL-RA (TUBING) ×2 IMPLANT
WIRE EMERALD 3MM-J .035X150CM (WIRE) ×2 IMPLANT

## 2017-02-13 NOTE — Anesthesia Postprocedure Evaluation (Addendum)
Anesthesia Post Note  Patient: Thomas Atkinson  Procedure(s) Performed: ANEURYSM ABDOMINAL AORTIC REPAIR WITH HEMASHIELD GOLD VASCULAR GRAFTS. ILIO-FEMORAL BY-PASS . (N/A Abdomen) BYPASS GRAFT ILIO-FEMORAL ARTERY (N/A Groin) OPEN FEMORAL ARTERY EXPOSURE (Left Groin) CORONARY ANGIOGRAM (N/A ) INTRA-AORTIC BALLOON PUMP INSERTION     Patient location during evaluation: SICU Anesthesia Type: General Level of consciousness: sedated Pain management: pain level controlled Vital Signs Assessment: vitals unstable Respiratory status: patient remains intubated per anesthesia plan Cardiovascular status: unstable Postop Assessment: no apparent nausea or vomiting Anesthetic complications: no    Last Vitals:  Vitals:   04-14-17 1853 04-14-17 1855  BP: 119/70 119/84  Pulse:  98  Resp: 12 12  Temp:  (!) 34.1 C  SpO2:  (!) 87%    Last Pain:  Vitals:   04-14-17 1855  TempSrc: Core (Comment)  PainSc:                  Trygg Mantz

## 2017-02-13 NOTE — Op Note (Signed)
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 Post-operative diagnosis:  Same Surgeon:  Annamarie Major Assistants: [facility] Procedure:   #1: Open repair of juxtarenal abdominal aortic aneurysm using a 18 x 9 bifurcated graft with distal anastomosis to bilateral    common iliac arteries   #2: Ultrasound-guided access, right femoral artery   #3: Abdominal aortogram   #4: Placement of balloon occlusion device in the juxtarenal aorta   #5: Open exposure of right common femoral artery   #6: Right superficial femoral and popliteal artery thromboembolectomy   #7: Right iliofemoral bypass graft   #8: Open exposure left common femoral artery   #9: Left iliofemoral thrombectomy and primary closure      Anesthesia: General Blood Loss:  See anesthesia record.  Multiple units of PRBC and FFP were administered as well as Cell Saver Specimens: None  Findings: Ruptured 10 cm juxtarenal aneurysm.  An 18 x 9 bifurcated graft was initially placed with a end to end anastomosis to an ectatic infrarenal aorta at the level of the lower right renal artery.  The distal anastomosis was end to end to the bilateral common iliac arteries.  During abdominal wall closure, the patient was found to have tombstones on his EKG and then became asystolic.  Chest compressions were initiated.  I had Dr. Burt Knack come in and once we are able to regain a pulse he performed coronary angiography which did not show any lesions amenable to repair.  He had poor contractility of the lateral wall.  A balloon pump was placed.  An aortogram was performed before placing the balloon pump and this revealed the right limb of the graft had occluded.  I exposed the common femoral artery and try to perform thrombectomy, however this was unsuccessful and therefore I reopened the abdomen and performed a jump graft  from the right limb of the graft down to the common femoral artery.  There was a significant amount of thrombus within the right superficial femoral and popliteal artery which was removed with Fogarty catheter.  Once this was completed the drapes were removed and the left leg was found to be mottled and ischemic.  The patient was then reprepped and I exposed the left femoral artery.  I removed the balloon pump.  What was thought to be a second venous sheath was actually in the artery which was likely occlusive.  I repaired the femoral artery and was able to close it transversely.  Indications: The patient presented to the emergency department with severe abdominal pain.  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  He was taken emergently to the operating room.  I had a brief conversation with the wife.  This procedure was done under emergency consent.  Procedure: I escorted the patient from the CT scanner to the operating room, room 16.  Anesthesia began placing arterial and venous access prior to intubation.  Once access was obtained, the patient was prepped and draped in the usual sterile fashion.  I evaluated the right common femoral artery with ultrasound.  I used 1% lidocaine and made a skin nick.  I cannulated the right common femoral artery with an 18-gauge needle.  I advanced a 035 wire without resistance.  The subcutaneous tissue was dilated with 8 French dilator and pro-glide devices were placed at the 11:00 and 1 o'clock position for pre-closure.  An 8 French sheath was placed.  Using a Berenstein catheter, an Amplatz superstiff wire was placed into the descending thoracic aorta.  The 8 French sheath was exchanged out for a 12 [place] sheath.  I performed an arteriogram through the sheath which located the renal arteries.  I then inserted a Q-50 balloon and inflated this at the level of the renal arteries for proximal control.  At this point in time, the patient was  intubated  The patient was not a candidate for endovascular repair and therefore a midline incision was made from the xiphoid down below the umbilicus.  Cautery was used about subtenons tissue down to the fascia which was then opened with Bovie cautery.  The peritoneum was then entered sharply and then opened throughout the length of the incision.  A Balfour and Omni-Tract retractor were inserted for exposure.  The patient had an obvious retroperitoneal rupture of his aneurysm.  The transverse colon was reflected cephalad and the small bowel was mobilized to the patient's right.  The ligament of Treitz was taken down with cautery and sharp dissection.  I then dissected out the neck of the aneurysm which was very ectatic and aneurysmal up to the level of the renal arteries.  I mobilized the renal vein so that I could get a clamp just below the renal arteries.  There was a left accessory renal artery that was sacrificed.  Once proximal exposure was obtained, I dissected out both common iliac arteries.  Aneurysm ended at the aortic bifurcation.  I got circumferential control of the bilateral iliac arteries which were calcified posteriorly.  I did give the patient 5000 units of heparin at this time.  An aortic DeBakey clamp was used to occlude the infrarenal abdominal aorta.  Before doing this I removed the occlusion balloon and withdrew the sheath into the iliac artery.  The wire was also removed.  I then used a #11 blade to open the aneurysm.  This was extended with curved Mayo scissors proximally and distally.  I transected the aorta after removing all of the mural thrombus.  There was aneurysmal tissue up to the level of the clamp.  There was no back bleeding from the inferior mesenteric artery or any lumbars.  I selected a 18 x 9 bifurcated graft.  I performed a end to end anastomosis between the graft and the aneurysmal infrarenal aorta, reinforcing this with a felt strip.  This was done with a running 3-0  Prolene.  Once this was completed I tested the proximal anastomosis and it was hemostatic.  I then reoccluded the infrarenal aorta and selected a 22 mm Dacron tube graft and placed this up around the proximal anastomosis for reinforcement.  The proximal clamp was released.  There was excellent inflow to the graft which was then reoccluded.  Attention was then turned towards the left common iliac artery.  It was transected.  There was a significant amount of posterior plaque I cut the left limb of the graft to the appropriate length and performed a running end to end anastomosis with 5-0 Prolene.  Prior to completion, the appropriate flushing maneuvers were performed and the anastomosis was completed and blood flow was reestablished to the left leg.  Next attention was turned towards the left common iliac artery.  I had to dissect this out distally.  The ureter was visualized and protected.  I then transected the left common iliac artery.  The right limb of the graft was cut to the appropriate  length and a running end to end anastomosis was completed.  Prior to completion, the appropriate flushing maneuvers were performed and blood flow was reestablished to the right leg.  At this point, the patient had palpable femoral pulses.  The patient was continued to get blood and FFP.  I did give protamine to help with the heparin reversal.  At this point, I removed the sheath from the right groin and secured the arteriotomy by closing the probe glide devices.  This was hemostatic.  Once I was satisfied with the hemostasis.  I closed the remnant of the aneurysm sac over the graft with running 2-0 Vicryl.  The retroperitoneum was then reapproximated with a running 2-0 Vicryl.  The small bowel was placed back into its anatomic position.  I ran the bowel to make sure there were no defects.  The transverse colon was placed back.  I then closed the abdominal wall fascia with 2 #1 PDS suture.  Just prior to closure of the abdominal  wall fascia, the patient began to have tombstones on his EKG and shortly thereafter he came pulseless.  Chest compressions were immediately initiated.  Multiple pressors were given.  We continued with chest compressions and contacted cardiology.  Dr. Burt Knack came in.  We were ultimately able to get a pulse back.  At this point I completed closure of the abdomen with the PDS suture and then stapled the incision.  Dr. Burt Knack came in and performed coronary angiography.  This did not show any lesions amenable to stenting.  We elected to leave a balloon pump in place.  When placing the balloon pump and other arteriogram of the aorta was performed and this showed occlusion of the right limb of the graft.  Attention was then turned towards the right groin.  I made a longitudinal incision in the groin and dissected out the common femoral artery from the inguinal ligament down to the bifurcation.  The probe glide devices were removed and I made a transverse arteriotomy.  I tried to advance a Fogarty catheter up into the graft but met resistance and could not get the Fogarty catheter up into the aorta to successfully perform a thrombectomy.  I then passed the Fogarty catheter down the leg and was able to hobble a #3 and 4 Fogarty catheter.  Significant thrombus was evacuated.  Thrombectomy was performed until a negative past was obtained.  I then closed the arteriotomy transversely.  At this point I reopened the patient's abdomen by removing the staples and suture.  The Omni tract was repositioned.  I exposed the right limb of the graft.  I felt the best decision at this time was to transect the graft, and then perform thrombectomy of the right limb.  Once a had good flow through the right limb of the graft, I made a tunnel from the femoral incision up into the abdomen.  I selected an 8 mm [place] graft and brought to the tunnel.  I performed a end to end anastomosis between the right limb of the graft and the new 8 mm graft.   Once this was done, I occluded the common femoral artery.  I opened the artery distal to my previous arteriotomy.  I made a longitudinal arteriotomy and extended this with Potts scissors.  The 8 mm graft was cut to the appropriate length and a running end to side anastomosis was created with 5-0 Prolene.  Prior to completion the appropriate flushing maneuvers were performed and the anastomosis was  completed.  I again reversed the additional heparin that was given.  Once I was satisfied with hemostasis in the abdomen, I reapproximated the retroperitoneum with running 2-0 Vicryl.  Again the bowel was placed back into its anatomic position as well as the transverse colon.  The abdominal wall fascia was closed with 2 running #1 PDS suture, and the skin was closed with staples.  The right groin was closed with multiple layers of 2-0 Vicryl followed by staples.  At this point the drapes were removed.  The left leg was mottled and ischemic.  The patient was then again reprepped and draped.  I discussed with cardiology removing the balloon pump.  I made a longitudinal incision in the right groin and dissected out the common femoral artery proximal and distal to the balloon pump.  There was also what was felt to be a venous sheath that was placed during chest compressions.  I turned off the balloon pump and make sure the patient remained hemodynamically stable which he did.  I then removed the balloon pump.  I passed the Fogarty catheter down the left leg and did not evacuate some thromboembolic debris which was very minimal.  There was good backbleeding.  I advanced the Fogarty catheter up into the aorta and did not get any thrombus but had excellent inflow.  I then closed the left femoral arteriotomy in a transverse fashion with 5-0 Prolene.  I then removed what I thought was a venous sheath but realized that it was in the side of the left femoral artery.  I closed this arteriotomy with a pledgeted 5-0 suture.  The  patient had an excellent pulse in his left groin and a brisk Doppler signal here.  Left groin was made hemostatic and I reapproximated the tissue with multiple layers of 2-0 Vicryl followed by staples.  Sterile dressings were applied.  The patient will be taken to the intensive care unit in critical condition   Disposition: To ICU intubated and critical condition   V. Annamarie Major, M.D. [facility] Office: [phone] Pager:  [phone]

## 2017-02-13 NOTE — Anesthesia Procedure Notes (Signed)
Procedure Name: Intubation Date/Time: 02/12/2017 9:25 AM Performed by: Barrington Ellison, CRNA Pre-anesthesia Checklist: Patient identified, Emergency Drugs available, Suction available and Patient being monitored Patient Re-evaluated:Patient Re-evaluated prior to induction Oxygen Delivery Method: Circle System Utilized Preoxygenation: Pre-oxygenation with 100% oxygen Induction Type: IV induction, Rapid sequence and Cricoid Pressure applied Ventilation: Mask ventilation without difficulty Laryngoscope Size: Mac and 3 Grade View: Grade II Tube type: Subglottic suction tube Tube size: 7.5 mm Number of attempts: 1 Airway Equipment and Method: Stylet and Oral airway Placement Confirmation: ETT inserted through vocal cords under direct vision,  positive ETCO2 and breath sounds checked- equal and bilateral Secured at: 23 cm Tube secured with: Tape Dental Injury: Teeth and Oropharynx as per pre-operative assessment

## 2017-02-13 NOTE — Anesthesia Procedure Notes (Signed)
Central Venous Catheter Insertion Performed by: Annye Asa, MD, anesthesiologist Start/End01/28/2019 8:57 AM, 02/08/2017 9:00 AM Patient location: OR. Emergency situation Preanesthetic checklist: patient identified, IV checked, risks and benefits discussed, monitors and equipment checked, pre-op evaluation, timeout performed and anesthesia consent Position: supine Lidocaine 1% used for infiltration Hand hygiene performed , maximum sterile barriers used  and Seldinger technique used Catheter size: 8.5 Fr Central line was placed.Sheath introducer Procedure performed using ultrasound guided technique. Ultrasound Notes:anatomy identified, needle tip was noted to be adjacent to the nerve/plexus identified, no ultrasound evidence of intravascular and/or intraneural injection and image(s) printed for medical record Attempts: 1 Following insertion, line sutured, dressing applied and Biopatch. Post procedure assessment: blood return through all ports, free fluid flow and no air  Patient tolerated the procedure well with no immediate complications. Additional procedure comments: Central introducer: Timeout, sterile prep, drape, FBP R neck.  Supine position.  1% lido local, finder and trocar RIJ 1st pass with US guidance.  Introducer placed over J wire. Biopatch and sterile dressing on.  Patient tolerated well.  VSS.  Jenita Seashore, MD.

## 2017-02-13 NOTE — OR Nursing (Signed)
Procedure # 1 - 0913 to 1324. Procedure #2 - 1336 - 1421 Procedure # 3 - 1421 -1627 Procedure #4 Begun at 1701 - 1811

## 2017-02-13 NOTE — Anesthesia Preprocedure Evaluation (Signed)
Anesthesia Evaluation  Patient identified by MRN, date of birth, ID band Patient awake    Reviewed: Allergy & Precautions, NPO status Preop documentation limited or incomplete due to emergent nature of procedure.  History of Anesthesia Complications Negative for: history of anesthetic complications  Airway Mallampati: II  TM Distance: >3 FB Neck ROM: Full    Dental  (+) Chipped, Poor Dentition, Dental Advisory Given   Pulmonary COPD, Current Smoker,    breath sounds clear to auscultation       Cardiovascular hypertension, Pt. on medications and Pt. on home beta blockers (-) angina+ Peripheral Vascular Disease (ruptured aneurysm: mottled, anxious, but VSS)   Rhythm:Regular Rate:Tachycardia     Neuro/Psych negative neurological ROS     GI/Hepatic Neg liver ROS, Nauseated, intense abd pain   Endo/Other  negative endocrine ROS  Renal/GU Renal InsufficiencyRenal disease (creat 1.67)     Musculoskeletal   Abdominal   Peds  Hematology negative hematology ROS (+)   Anesthesia Other Findings Extremely mottled  Reproductive/Obstetrics                             Anesthesia Physical Anesthesia Plan  ASA: IV and emergent  Anesthesia Plan: General   Post-op Pain Management:    Induction: Intravenous  PONV Risk Score and Plan: 1 and Ondansetron, Treatment may vary due to age or medical condition and Dexamethasone  Airway Management Planned: Oral ETT  Additional Equipment: Arterial line, CVP and Ultrasound Guidance Line Placement  Intra-op Plan:   Post-operative Plan: Possible Post-op intubation/ventilation  Informed Consent: I have reviewed the patients History and Physical, chart, labs and discussed the procedure including the risks, benefits and alternatives for the proposed anesthesia with the patient or authorized representative who has indicated his/her understanding and acceptance.    Dental advisory given and Only emergency history available  Plan Discussed with: CRNA and Surgeon  Anesthesia Plan Comments: (Plan routine monitors, A line, central access, GETA with probable post op ventilation)        Anesthesia Quick Evaluation

## 2017-02-13 NOTE — Transfer of Care (Signed)
Immediate Anesthesia Transfer of Care Note  Patient: Thomas Atkinson  Procedure(s) Performed: ANEURYSM ABDOMINAL AORTIC REPAIR WITH HEMASHIELD GOLD VASCULAR GRAFTS. ILIO-FEMORAL BY-PASS . (N/A Abdomen) BYPASS GRAFT ILIO-FEMORAL ARTERY (N/A Groin) OPEN FEMORAL ARTERY EXPOSURE (Left Groin) CORONARY ANGIOGRAM (N/A ) INTRA-AORTIC BALLOON PUMP INSERTION  Patient Location: ICU  Anesthesia Type:General  Level of Consciousness: Patient remains intubated per anesthesia plan  Airway & Oxygen Therapy: Patient remains intubated per anesthesia plan and Patient placed on Ventilator (see vital sign flow sheet for setting)  Post-op Assessment: Report given to RN  Post vital signs: Reviewed and stable  Last Vitals:  Vitals:   03/02/2017 1846 02/24/2017 1850  Pulse:  97  Resp: (!) 25 14  Temp: (!) 34.1 C (!) 34.1 C  SpO2:  (!) 84%    Last Pain:  Vitals:   03/02/2017 1850  TempSrc: Core (Comment)  PainSc:          Complications: No apparent anesthesia complications

## 2017-02-13 NOTE — Progress Notes (Signed)
[facility]  Patient name: Thomas Atkinson MRN: [id number] DOB: 09-03-1950 Sex: male   REQUESTING PROVIDER:    Er   REASON FOR CONSULT:    Ruptured abdominal aortic aneurysm  HISTORY OF PRESENT ILLNESS:   Thomas Atkinson is a 67 y.o. male, who presented to the emergency department this morning with severe abdominal and back pain.  This is been going on for just a few hours.  His only past medical history is that of hypertension  PAST MEDICAL HISTORY    Past Medical History:  Diagnosis Date  . Arthritis    knees, back  . Chondromalacia of patella    right  . Heart murmur   . Hypertension   . Snores      FAMILY HISTORY   Not pertinent to clinical condition  SOCIAL HISTORY:   Social History   Socioeconomic History  . Marital status: Married    Spouse name: Not on file  . Number of children: Not on file  . Years of education: Not on file  . Highest education level: Not on file  Social Needs  . Financial resource strain: Not on file  . Food insecurity - worry: Not on file  . Food insecurity - inability: Not on file  . Transportation needs - medical: Not on file  . Transportation needs - non-medical: Not on file  Occupational History  . Not on file  Tobacco Use  . Smoking status: Current Every Day Smoker    Types: E-cigarettes  . Smokeless tobacco: Never Used  Substance and Sexual Activity  . Alcohol use: Yes    Comment: 2xweek  . Drug use: No  . Sexual activity: Not on file  Other Topics Concern  . Not on file  Social History Narrative  . Not on file    ALLERGIES:    No Known Allergies  CURRENT MEDICATIONS:    Current Facility-Administered Medications  Medication Dose Route Frequency Provider Last Rate Last Dose  . 0.9 %  sodium chloride infusion   Intravenous Once Virgel Manifold, MD      . 0.9 %  sodium chloride infusion   Intravenous Once Duane Boston, MD      . 0.9 %   sodium chloride infusion   Intravenous Once Serafina Mitchell, MD      . 0.9 %  sodium chloride infusion   Intravenous Once Serafina Mitchell, MD      . 0.9 %  sodium chloride infusion  500 mL Intravenous Once PRN Rhyne, Samantha J, PA-C      . 0.9 %  sodium chloride infusion  250 mL Intravenous PRN Hammonds, Sharyn Blitz, MD      . acetaminophen (TYLENOL) tablet 325-650 mg  325-650 mg Oral Q4H PRN Rhyne, Samantha J, PA-C       Or  . acetaminophen (TYLENOL) suppository 325-650 mg  325-650 mg Rectal Q4H PRN Rhyne, Samantha J, PA-C      . albuterol (PROVENTIL) (2.5 MG/3ML) 0.083% nebulizer solution 2.5 mg  2.5 mg Nebulization Q2H PRN Hammonds, Sharyn Blitz, MD      . amiodarone (NEXTERONE PREMIX) 360-4.14 MG/200ML-% (1.8 mg/mL) IV infusion  30 mg/hr Intravenous Continuous Hammonds, Sharyn Blitz, MD 16.7 mL/hr at 03/01/2017 2200 30 mg/hr at 02/18/2017 2200  . bisacodyl (DULCOLAX) suppository 10 mg  10 mg Rectal Daily PRN Rhyne, Samantha J, PA-C      . cefUROXime (ZINACEF) 1.5 g in dextrose 5 % 50 mL IVPB  1.5 g Intravenous Q12H Gabriel Earing, PA-C   Stopped at 02/21/2017 2225  . chlorhexidine gluconate (MEDLINE KIT) (PERIDEX) 0.12 % solution 15 mL  15 mL Mouth Rinse BID Hammonds, Sharyn Blitz, MD   15 mL at 03/03/2017 2100  . dexmedetomidine (PRECEDEX) 400 MCG/100ML (4 mcg/mL) infusion  0.4-1.2 mcg/kg/hr Intravenous Titrated Serafina Mitchell, MD   Stopped at 02/17/2017 2039  . [START ON 07-Mar-2017] docusate sodium (COLACE) capsule 100 mg  100 mg Oral Daily Rhyne, Samantha J, PA-C      . EPINEPHrine (ADRENALIN) 4 mg in dextrose 5 % 250 mL (0.016 mg/mL) infusion  0.5-20 mcg/min Intravenous Titrated Hammonds, Sharyn Blitz, MD 3.8 mL/hr at 02/05/2017 2200 1 mcg/min at 02/22/2017 2200  . fentaNYL (SUBLIMAZE) injection 50 mcg  50 mcg Intravenous Q15 min PRN Hammonds, Sharyn Blitz, MD      . fentaNYL (SUBLIMAZE) injection 50 mcg  50 mcg Intravenous Q2H PRN Hammonds, Sharyn Blitz, MD      . hydrALAZINE (APRESOLINE) injection 5 mg  5 mg  Intravenous Q20 Min PRN Rhyne, Samantha J, PA-C      . insulin aspart (novoLOG) injection 2-6 Units  2-6 Units Subcutaneous Q4H Hammonds, Sharyn Blitz, MD   6 Units at 03/04/2017 2023  . labetalol (NORMODYNE,TRANDATE) injection 10 mg  10 mg Intravenous Q10 min PRN Rhyne, Samantha J, PA-C      . lactated ringers infusion   Intravenous Continuous Hammonds, Sharyn Blitz, MD 20 mL/hr at 02/16/2017 2300    . magnesium sulfate IVPB 2 g 50 mL  2 g Intravenous Once PRN Rhyne, Samantha J, PA-C      . [START ON 07-Mar-2017] MEDLINE mouth rinse  15 mL Mouth Rinse QID Hammonds, Sharyn Blitz, MD      . metoprolol tartrate (LOPRESSOR) injection 2-5 mg  2-5 mg Intravenous Q2H PRN Rhyne, Samantha J, PA-C      . midazolam (VERSED) injection 2 mg  2 mg Intravenous Q15 min PRN Hammonds, Sharyn Blitz, MD      . midazolam (VERSED) injection 2 mg  2 mg Intravenous Q2H PRN Hammonds, Sharyn Blitz, MD      . ondansetron (ZOFRAN) injection 4 mg  4 mg Intravenous Q6H PRN Rhyne, Samantha J, PA-C      . pantoprazole (PROTONIX) injection 40 mg  40 mg Intravenous QHS Rhyne, Samantha J, PA-C   40 mg at 02/12/2017 2158  . phenylephrine (NEO-SYNEPHRINE) 10 mg in sodium chloride 0.9 % 250 mL (0.04 mg/mL) infusion  0-400 mcg/min Intravenous Titrated Hammonds, Sharyn Blitz, MD 60 mL/hr at 02/11/2017 2200 40 mcg/min at 02/07/2017 2200    REVIEW OF SYSTEMS:   [X]  denotes positive finding, [ ]  denotes negative finding Cardiac  Comments:  Chest pain or chest pressure:    Shortness of breath upon exertion:    Short of breath when lying flat:    Irregular heart rhythm:        Vascular    Pain in calf, thigh, or hip brought on by ambulation:    Pain in feet at night that wakes you up from your sleep:     Blood clot in your veins:    Leg swelling:         Pulmonary    Oxygen at home:    Productive cough:     Wheezing:         Neurologic    Sudden weakness in arms or legs:     Sudden numbness in arms or legs:  Sudden onset of difficulty speaking  or slurred speech:    Temporary loss of vision in one eye:     Problems with dizziness:         Gastrointestinal    Blood in stool:      Vomited blood:         Genitourinary    Burning when urinating:     Blood in urine:        Psychiatric    Major depression:         Hematologic    Bleeding problems:    Problems with blood clotting too easily:        Skin    Rashes or ulcers:        Constitutional    Fever or chills:     PHYSICAL EXAM:   Vitals:   02/06/2017 2245 02/09/2017 2300 02/25/2017 2315 02/10/2017 2330  BP: 119/87 112/82 113/75 116/90  Pulse:  (!) 113 (!) 113 (!) 108  Resp: 18 18 18 18   Temp: 97.7 F (36.5 C) 97.7 F (36.5 C) 97.7 F (36.5 C) 97.7 F (36.5 C)  TempSrc:      SpO2:   99%   Weight:        GENERAL: The patient is a well-nourished male, in no acute distress. The vital signs are documented above. CARDIAC: There is a regular rate and rhythm.  VASCULAR: Pedal pulses not palpable PULMONARY: Nonlabored respirations ABDOMEN: Severe abdominal pain.  MUSCULOSKELETAL: There are no major deformities or cyanosis. NEUROLOGIC: No focal weakness or paresthesias are detected. SKIN: There are no ulcers or rashes noted. PSYCHIATRIC: The patient has a normal affect.  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  Family was briefly updated at the bedside.   Annamarie Major, MD [facility] [phone] Pager [phone]

## 2017-02-13 NOTE — Progress Notes (Signed)
Dr. Myra GianottiBrabham at bedside - able to doppler pulses in both legs. Will continue to monitor pt closely. Family at bedside and updated.

## 2017-02-13 NOTE — Consult Note (Signed)
PULMONARY / CRITICAL CARE MEDICINE   Name: Thomas Atkinson MRN: [id number] DOB: 1950/09/29    ADMISSION DATE:  Mar 14, 2017 CONSULTATION DATE:  1/9  REFERRING MD:  Dr. Myra Gianotti  CHIEF COMPLAINT:  shock  HISTORY OF PRESENT ILLNESS:  Patient is encephalopathic and/or intubated. Therefore history has been obtained from chart review.  67 year old male with past medical history as below, which is significant for hypertension and heart murmur.  He presented to [facility] emergency department on 1/9 complaining of abdominal pain with radiation to left hip and back.  He was hypotensive as well.  Imaging in the emergency department consistent with abdominal aortic aneurysm rupture.  He was taken emergently to the operating room under Dr. Myra Gianotti for repair.  Procedure was trying to and and the patient developed ST elevation and went into VF and eventually PEA arrest.  Downtime estimated at 20 minutes.  Perioperative course then complicated by ischemia of both lower extremities requiring the patient to be reopened.  Postoperatively the patient was transferred to the ICU in critical and unstable condition.  PCCM asked to evaluate  PAST MEDICAL HISTORY :  He  has a past medical history of Arthritis, Chondromalacia of patella, Heart murmur, Hypertension, and Snores.  PAST SURGICAL HISTORY: He  has a past surgical history that includes Tonsillectomy; Bunionectomy (Left); Back surgery; and Knee arthroscopy with medial menisectomy (Right, 02/17/2015).  No Known Allergies  No current facility-administered medications on file prior to encounter.    Current Outpatient Medications on File Prior to Encounter  Medication Sig  . benazepril (LOTENSIN) 20 MG tablet Take 20 mg by mouth daily.  . metoprolol (LOPRESSOR) 50 MG tablet Take 50 mg by mouth 2 (two) times daily.  . ondansetron (ZOFRAN) 4 MG tablet Take 1 tablet (4 mg total) by mouth every 8 (eight) hours as needed for nausea or vomiting.  Marland Kitchen  oxyCODONE-acetaminophen (ROXICET) 5-325 MG tablet Take 1-2 tablets by mouth every 4 (four) hours as needed.    FAMILY HISTORY:  His has no family status information on file.    SOCIAL HISTORY: He  reports that he has been smoking e-cigarettes.  he has never used smokeless tobacco. He reports that he drinks alcohol. He reports that he does not use drugs.  REVIEW OF SYSTEMS:   Unable as patient is encephalopathic and intubated.  SUBJECTIVE:    VITAL SIGNS: BP 121/83   Pulse 99   Temp (!) 94.6 F (34.8 C)   Resp 18   Wt 94.3 kg (208 lb)   SpO2 97%   BMI 29.84 kg/m   HEMODYNAMICS: PAP: (37-56)/(23-34) 39/27 CVP:  [0 mmHg-16 mmHg] 15 mmHg CO:  [4.3 L/min] 4.3 L/min CI:  [1.9 L/min/m2-2 L/min/m2] 1.9 L/min/m2  VENTILATOR SETTINGS: Vent Mode: Volume support FiO2 (%):  [100 %] 100 % Set Rate:  [12 bmp-18 bmp] 18 bmp Vt Set:  [600 mL] 600 mL PEEP:  [5 cmH20] 5 cmH20 Pressure Support:  [10 cmH20] 10 cmH20 Plateau Pressure:  [28 cmH20-30 cmH20] 28 cmH20  INTAKE / OUTPUT: I/O last 3 completed shifts: In: 7198.3 [I.V.:4033.3; Blood:1915; IV Piggyback:1250] Out: 1760 [Urine:660; Blood:1100]  PHYSICAL EXAMINATION: General:  Overweight african [place] male on vent.  Neuro:  Sedated HEENT:  [place]/AT, PERRL, no JVD Cardiovascular:  RRR, no MRG. Absent femoral and distal pulses bilaterally.  Lungs:  Clear bilateral breath sounsd Abdomen:  Distended, mottled, absent bowel sounds Musculoskeletal:  No acute deformity Skin:  Cyanotic, mottled lower extremities.   LABS:  BMET Recent Labs  Lab March 13, 2017 0751  03-13-17 1549 Mar 13, 2017 1631 2017/03/13 1901  NA 139   < > 146* 146* 145  K 3.3*   < > 4.4 4.4 4.6  CL 107  --   --   --  107  CO2 23  --   --   --   --   BUN 21*  --   --   --  23*  CREATININE 1.67*  --   --   --  1.70*  GLUCOSE 278*  --   --   --  333*   < > = values in this interval not displayed.    Electrolytes Recent Labs  Lab 03/13/2017 0751  CALCIUM 8.6*     CBC Recent Labs  Lab 13-Mar-2017 0751  03-13-17 1143  03-13-17 1631 2017/03/13 1840 Mar 13, 2017 1901  WBC 18.2*  --   --   --   --  12.1*  --   HGB 12.0*   < > 8.3*   < > 10.5* 10.7* 10.2*  HCT 37.2*   < > 25.1*   < > 31.0* 32.6* 30.0*  PLT 249  --  124*  --   --  PENDING  --    < > = values in this interval not displayed.    Coag's Recent Labs  Lab 2017-03-13 0810 03-13-2017 1143 Mar 13, 2017 1840  APTT 27 >200* 50*  INR 1.22 3.84 2.05    Sepsis Markers Recent Labs  Lab 03-13-17 0756  LATICACIDVEN 4.5*    ABG Recent Labs  Lab 03-13-17 1549 03-13-17 1631 03/13/2017 1855  PHART 7.191* 7.184* 7.150*  PCO2ART 36.2 39.4 51.1*  PO2ART 56.0* 65.0* 63.0*    Liver Enzymes Recent Labs  Lab 03-13-2017 0751  AST 23  ALT 21  ALKPHOS 56  BILITOT 0.9  ALBUMIN 3.0*    Cardiac Enzymes No results for input(s): TROPONINI, PROBNP in the last 168 hours.  Glucose No results for input(s): GLUCAP in the last 168 hours.  Imaging Dg Abd 1 View  Result Date: 03/13/17 CLINICAL DATA:  abdominal aortic aneurysm repair.  Instrument count EXAM: ABDOMEN - 1 VIEW COMPARISON:  CT 03-13-2017 FINDINGS: Midline skin staples noted. LEFT femoral central venous line present. No retained surgical instruments on imaged abdomen. Portion of the lateral RIGHT abdomen excluded. Tip of the NG tube identified. IMPRESSION: No retained surgical instruments in the field of view. Electronically Signed   By: Genevive Bi M.D.   On: 03-13-2017 16:58   Dg Chest Port 1 View  Result Date: 2017/03/13 CLINICAL DATA:  Status post aortic aneurysm repair. EXAM: PORTABLE CHEST 1 VIEW COMPARISON:  02/14/2016 FINDINGS: Endotracheal tube in satisfactory position. Swan-Ganz catheter loops on itself in the region of the right atrium and terminates at the expected location of the main pulmonary trunk. Enteric catheter collimated off the image. Enlarged cardiac silhouette. Interval development of bilateral patchy alveolar  opacities with central predominance. Osseous structures are without acute abnormality. Soft tissues are grossly normal. IMPRESSION: Swan-Ganz catheter loops on itself in the region of the right atrium and terminates in the expected location of the main pulmonary trunk. Interval development of bilateral alveolar and interstitial opacities, likely representing mixed pulmonary edema. Electronically Signed   By: Ted Mcalpine M.D.   On: March 13, 2017 19:52   Dg Chest Portable 1 View  Result Date: 13-Mar-2017 CLINICAL DATA:  Instrument count EXAM: PORTABLE CHEST 1 VIEW COMPARISON:  03-13-17 FINDINGS: Interstitial and alveolar airspace opacities throughout the right lung likely reflecting asymmetric edema.  Mild interstitial prominence of the left lung. No pneumothorax. Stable cardiomediastinal silhouette. Swan-Ganz catheter coiled in the right atrium with the tip projecting over the right ventricular outflow tract. Endotracheal tube with the tip 6 cm above the carina. Small metallic focus projecting over the left side of the heart which may reflect some equipment superficial to the patient. Correlate with clinical exam. IMPRESSION: No radiopaque metallic foreign body. Swan-Ganz catheter coiled in the right atrium with the tip projecting over the right ventricular outflow tract. Endotracheal tube with the tip 6 cm above the carina. These results were called by telephone at the time of interpretation on March 04, 2017 at 4:57 pm to the circulating nurse, who verbally acknowledged these results. Electronically Signed   By: Elige Ko   On: 04-Mar-2017 17:00   Dg Chest Portable 1 View  Result Date: 03/04/17 CLINICAL DATA:  Shortness of breath.  Left-sided lower chest pain. EXAM: PORTABLE CHEST 1 VIEW COMPARISON:  None. FINDINGS: The heart size and mediastinal contours are within normal limits. Both lungs are clear. The visualized skeletal structures are unremarkable. IMPRESSION: No active disease. Electronically Signed    By: Francene Boyers M.D.   On: 04-Mar-2017 08:39   Dg Abd Portable 1v  Result Date: 04-Mar-2017 CLINICAL DATA:  Shortness of breath.  Left-sided lower chest pain. EXAM: PORTABLE ABDOMEN - 1 VIEW COMPARISON:  None. FINDINGS: Left lateral decubitus view of the abdomen demonstrates no evidence of free air. The visualized bowel gas pattern is normal. No appreciable bone abnormality. IMPRESSION: Negative.  Specifically, no free air in the abdomen. Electronically Signed   By: Francene Boyers M.D.   On: 03/04/2017 08:38   Ct Angio Chest/abd/pel For Dissection W And/or Wo Contrast  Result Date: 2017/03/04 CLINICAL DATA:  Chest and abdominal pain EXAM: CT ANGIOGRAPHY CHEST, ABDOMEN AND PELVIS TECHNIQUE: Initially, axial CT images were obtained through the chest without intravenous contrast material. Multidetector CT imaging through the chest, abdomen and pelvis was performed using the standard protocol during bolus administration of intravenous contrast. Multiplanar reconstructed images and MIPs were obtained and reviewed to evaluate the vascular anatomy. CONTRAST:  ISOVUE-370 IOPAMIDOL (ISOVUE-370) INJECTION 76% COMPARISON:  None. FINDINGS: CTA CHEST FINDINGS Cardiovascular: There is no intramural hematoma evident on the noncontrast enhanced study. There is no appreciable thoracic aortic aneurysm or dissection. There is extensive atherosclerotic plaque in the arch and descending thoracic aorta with multiple areas of irregularity along the wall of the descending aorta. Visualized great vessels appear unremarkable. No pulmonary embolus evident. There is no appreciable pericardial effusion or pericardial thickening. Mediastinum/Nodes: Thyroid appears unremarkable. There are scattered subcentimeter mediastinal lymph nodes. There is a lymph node in the right hilum measuring 1.5 x 1.3 cm. No esophageal lesions are appreciable. Lungs/Pleura: There is bibasilar atelectatic change. There is no frank edema or consolidation.  No appreciable pleural effusion or pleural thickening. Musculoskeletal: There are no blastic or lytic bone lesions. There is degenerative change in the thoracic spine. Review of the MIP images confirms the above findings. CTA ABDOMEN AND PELVIS FINDINGS VASCULAR Aorta: There is extensive atherosclerotic calcification in the aorta. There is aneurysmal dilatation in the distal aorta measuring approximately 9.9 x 7.7 cm. Extensive thrombus is noted throughout much of this aneurysm. No appreciable dissection. There is active extravasation of contrast from the mid abdominal aorta extending to the left. There is a large hematoma in the left retroperitoneum with active extravasation extending into this hematoma. This hematoma extends well into the left pelvis. Currently, the hematoma measures  approximately 22.7 x 13.9 x 7.8 cm. Note that the area of contrast extravasation from the aorta is distal to the renal arteries. Celiac: There is approximately 90% diameter stenosis at the origin of the celiac axis. No celiac artery aneurysm or dissection. Celiac artery branches appear patent. SMA: There is moderate atherosclerotic calcification in the proximal superior mesenteric artery with approximately 50% diameter stenosis proximally. No aneurysm or dissection. Major mesenteric branches appear patent. Renals: A single renal artery is seen on the right. There is a main renal artery on the left with a tiny accessory renal artery supplying the superior left kidney. There is atherosclerotic irregularity in each proximal renal artery. There is approximately 90% diameter stenosis at the proximal aspect of the left main renal artery. No dissection in either vessel. No fibromuscular dysplasia evident. 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 Inflow: There is extensive atherosclerotic irregularity in both common iliac arteries. There are multiple areas of irregular atherosclerosis. No stenosis greater than 50% diameter  is seen in these vessels. Note that there is a focal dissection in the proximal right common iliac artery extending over a distance of just over 1 cm. No other pelvic arterial dissection evident. There is moderate atherosclerotic calcification in both common femoral arteries. There is 50-60% diameter stenosis in each distal common femoral artery. The visualized proximal superficial femoral and profunda femoral arteries are patent. Veins: No obvious venous abnormality within the limitations of this arterial phase study. Review of the MIP images confirms the above findings. NON-VASCULAR Hepatobiliary: No focal liver lesions are evident. Gallbladder wall is not appreciably thickened. There is no biliary duct dilatation. Pancreas: No pancreatic mass or inflammatory focus. Spleen: No splenic lesions are evident. Adrenals/Urinary Tract: No adrenal lesions are evident. There is no renal mass or hydronephrosis on either side. The retroperitoneal hematoma on the left extends superiorly to impress upon the left kidney from a posterior approach. There is fluid extending into the left perinephric region from the hematoma arising from the aorta on the left side. No renal or ureteral calculus evident. Urinary bladder is midline with wall thickness within normal limits. Stomach/Bowel: There are multiple sigmoid diverticula without diverticulitis. There is no appreciable bowel wall thickening. No bowel obstruction. No free air or portal venous air. Lymphatic: Several prominent periprostatic region lymph nodes are noted, largest measuring 1.5 x 1.4 cm. No adenopathy elsewhere. Reproductive: Prostate appears normal in size and contour. The seminal vesicles appear somewhat irregular in contour and prominent. No well-defined pelvic mass. Other: No abscess or free fluid in the abdomen or pelvis noted. No periappendiceal region inflammation evident. Musculoskeletal: There is degenerative change in the lumbar spine. No blastic or lytic  bone lesions are evident. No intramuscular lesion or abdominal wall lesion evident. Review of the MIP images confirms the above findings. IMPRESSION: CT angiogram chest: 1. No thoracic aortic aneurysm or dissection. There is atherosclerotic calcification in the arch and descending aorta with multiple areas of atherosclerotic irregularity along the descending aorta. 2.  No evident pulmonary embolus. 3.  Patchy bibasilar atelectasis.  No consolidation. 4.  Mildly enlarged right hilar lymph node of uncertain etiology. 5.  No pericardial effusion or pericardial thickening. CT angiogram abdomen; CT angiogram pelvis 1. Abdominal aortic aneurysm measuring 9.9 x 7.7 cm. There is active contrast extravasation from the leftward aspect of the mid the distal aorta inferior to the renal arteries with large left retroperitoneal hematoma extending into the left pelvis. Appearance is consistent with active leakage from aortic aneurysm.  Note that there is thrombus throughout much of the abdominal aortic aneurysm. 2. Extensive atherosclerotic irregularity in multiple mesenteric and pelvic arterial vessels. Short-segment dissection noted in the proximal right common iliac artery. No other dissection evident. 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 3. Prominent seminal vesicles bilaterally withe periprostatic lymph node prominence. Advise correlation with PSA when patient is clinically stable. 4. Sigmoid diverticula without diverticulitis. No bowel obstruction. No abscess. 5. Mild impression on the left kidney due to large left retroperitoneal hematoma. No hydronephrosis. No renal or ureteral calculi. Critical Value/emergent results were called by telephone at the time of interpretation on 02/20/2017 at 9:15 a.m. to Dr. Raeford Razor , who verbally acknowledged these results. Due to the severity of the findings in the abdomen, I called report to Dr. Juleen China prior to initiating the dictation. Electronically Signed   By:  Bretta Bang III M.D.   On: 02/06/2017 09:32    STUDIES:  CT angiogram chest/abdomen/pelvis 1/9> abdominal aortic aneurysm measuring 9.9 x 7.7 cm there is active contrast extravasation with large retroperitoneal hematoma stented the left pelvis.  Extensive atherosclerotic irregularity and multiple mesenteric and pelvic arterial vessels short segment dissection noted in the proximal right common iliac artery.  Mild impression on the left kidney due to large retroperitoneal hematoma.  No hydronephrosis.  CULTURES:   ANTIBIOTICS: Periop Zinacef 1/9  SIGNIFICANT EVENTS:   LINES/TUBES: RIJ introducer 1/9 > ETT 1/9 > RIJ swan 1/9 >  DISCUSSION: 67 year old male presented with abdominal pain found to have ruptured AAA.  He was taken emergently to the operating room for repair, this was complicated by STEMI and cardiac arrest.  ROSC and 20 minutes.  He then occluded both lower extremities needs to be reopened.  Postoperatively he was in shock and transferred to the ICU in critical and unstable condition.  He has since developed critical ischemia to bilateral lower extremities and profound abdominal distention.  Remains in shock.  He has had no response neurologically despite minimal sedation.  Plans of being taken back to the OR tonight by vascular surgery.  Prognosis extremely poor.  ASSESSMENT / PLAN:  PULMONARY A: Acute hypoxemic/hypercarbic mixed respiratory failure Pulmonary edema  P:   Full vent support Follow ABG - vent settings adjusted Follow CXR VAP bundle  CARDIOVASCULAR A:  Cardiac arrest VF/PEA estimated 20 mins duration.  AAA rupture Cardiogenic shock Intraop STEMI Acute heart failure Critical limb ischemia bilateral lower extremities  P:  Vascular surgery primary Cardiology following MAP goal >26mmHg Swan in place Amiodarone infusion Epinephrine, norepinephrine, phenylephrine infusions as needed (currently off) Will need LHC if survives Vascular  returning to OR STAT  RENAL A:   AKI, will likely worsen AGMA  P:   At this point cannot give crystalloid volume. Will monitor Follow electrolytes, I&O  GASTROINTESTINAL A:   Abdominal distension - suspect secondary to intraabdominal hemorrhage   P:   KUB pending NPO  HEMATOLOGIC A:   ABLA Coagulopathy - ? DIC developing P:  Follow CBC Transfuse as indicated Fibrinogen pending  INFECTIOUS A:   Periop ABX  P:     ENDOCRINE A:   Hyperglycemia    P:   CBG monitoring and SSI  NEUROLOGIC A:   Acute metabolic +/- anoxic encephalopathy  P:   RASS goal: 0 to -1 Precedex infusion minimize as able.  Have not seen any response, concerned for poor neuro prognosis given cardiac arrest.   FAMILY  - Updates: Family updated 1/9 by Dr. Merlene Pulling. They understand  he is gravely critically ill and there is a strong possibility he will not survive.   - Inter-disciplinary family meet or Palliative Care meeting due by:  1/17   Joneen RoachPaul Niralya Ohanian, AGACNP-BC [facility] Pager [phone] or [phone]  02/17/2017 8:25 PM

## 2017-02-13 NOTE — Progress Notes (Signed)
RT NOTE:  Vent changes made by Dr. Merlene PullingHammonds. ABG in 1 hour.

## 2017-02-13 NOTE — Consult Note (Signed)
Cardiology Consultation:   Patient ID: Thomas Atkinson; [id number]; 03-20-1950   Admit date: 03/04/2017 Date of Consult: 02/26/2017  Primary Care Provider: Maurice Small, MD Primary Cardiologist: No primary care provider on file.    Patient Profile:   Thomas Atkinson is a 67 y.o. male with a hx of heart murmur and hypertension who is being seen today for the evaluation of cardiac arrest at the request of Dr Thomas Atkinson.  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 He underwent emergency surgery and as he was being closed he developed severe ST elevation on the monitor followed by cardiac arrest. He is evaluated in the [facility] operating room with ongoing CPR. He has received multiple shocks and rounds of epinephrine. ROSC is achieved and after discussion with Dr Thomas Atkinson we decided to proceed with emergency cardiac cath and salvage PCI for suspect acute myocardial infarction complicated by cardiac arrest.   Past Medical History:  Diagnosis Date  . Arthritis    knees, back  . Chondromalacia of patella    right  . Heart murmur   . Hypertension   . Snores     Past Surgical History:  Procedure Laterality Date  . BACK SURGERY     lumbar for spinal stenosis at [facility]  . BUNIONECTOMY Left   . KNEE ARTHROSCOPY WITH MEDIAL MENISECTOMY Right 02/17/2015   Procedure: RIGHT KNEE ARTHROSCOPY CHONDROPLASTY WITH MEDIAL MENISCECTOMY;  Surgeon: Loreta Ave, MD;  Location: [facility];  Service: Orthopedics;  Laterality: Right;  . TONSILLECTOMY       Home Medications:  Prior to Admission medications   Medication Sig Start Date End Date Taking? Authorizing Provider  benazepril (LOTENSIN) 20 MG tablet Take 20 mg by mouth daily.    [provider]  metoprolol (LOPRESSOR) 50 MG tablet Take 50 mg by mouth 2 (two) times daily.    [provider]  ondansetron (ZOFRAN) 4 MG tablet Take 1 tablet (4 mg total) by mouth every 8 (eight) hours as needed  for nausea or vomiting. 02/17/15   Cristie Hem, PA-C  oxyCODONE-acetaminophen (ROXICET) 5-325 MG tablet Take 1-2 tablets by mouth every 4 (four) hours as needed. 02/17/15   Cristie Hem, PA-C    Inpatient Medications: Scheduled Meds: . ondansetron      . vasopressin  1-10 Units Intravenous To OR   Continuous Infusions: . [MAR Hold] sodium chloride    . sodium chloride    . sodium chloride    . sodium chloride    . amiodarone    . cefUROXime (ZINACEF)  IV    . cefUROXime (ZINACEF)  IV    . epinephrine    . [MAR Hold] sodium chloride     PRN Meds: 0.9 % irrigation (POUR BTL), 70 mL Visipaque 320 mg/mL mixed with 0.9% normal saline (30 mL) injection, [MAR Hold] fentaNYL (SUBLIMAZE) injection, hemostatic agents, heparin irrigation 6000 unit, lidocaine  Allergies:   No Known Allergies  Social History:   Social History   Socioeconomic History  . Marital status: Married    Spouse name: Not on file  . Number of children: Not on file  . Years of education: Not on file  . Highest education level: Not on file  Social Needs  . Financial resource strain: Not on file  . Food insecurity - worry: Not on file  . Food insecurity - inability: Not on file  . Transportation needs - medical: Not on file  .  Transportation needs - non-medical: Not on file  Occupational History  . Not on file  Tobacco Use  . Smoking status: Current Every Day Smoker    Types: E-cigarettes  . Smokeless tobacco: Never Used  Substance and Sexual Activity  . Alcohol use: Yes    Comment: 2xweek  . Drug use: No  . Sexual activity: Not on file  Other Topics Concern  . Not on file  Social History Narrative  . Not on file    Family History:   No family history on file. - unable to obtain  ROS:  Please see the history of present illness.  ROS  Not able to obtain  Physical Exam/Data:   Vitals:   26-Feb-2017 1410 02-26-17 1415 02-26-2017 1420 2017-02-26 1425  Pulse: (!) 0 (!) 0 (!) 0 (!) 0  Resp: (!) 0  (!) 0 (!) 0 (!) 0  SpO2: (!) 0% (!) 0% (!) 0% (!) 0%  Weight:        Intake/Output Summary (Last 24 hours) at Feb 26, 2017 1446 Last data filed at Feb 26, 2017 1430 Gross per 24 hour  Intake 6165 ml  Output 975 ml  Net 5190 ml   Filed Weights   02-26-2017 0811  Weight: 208 lb (94.3 kg)   Body mass index is 29.84 kg/m.  Intubated, under general anesthesia Unable to examine patient who is prepped and draped for surgery  EKG:  The EKG was personally reviewed and demonstrates:  NSR 75 bpm, nonspecific ST abnormality  Relevant CV Studies: none  Laboratory Data:  Chemistry Recent Labs  Lab 02-26-17 0751  NA 139  K 3.3*  CL 107  CO2 23  GLUCOSE 278*  BUN 21*  CREATININE 1.67*  CALCIUM 8.6*  GFRNONAA 41*  GFRAA 47*  ANIONGAP 9    Recent Labs  Lab 2017/02/26 0751  PROT 5.3*  ALBUMIN 3.0*  AST 23  ALT 21  ALKPHOS 56  BILITOT 0.9   Hematology Recent Labs  Lab 2017-02-26 0751 2017-02-26 1143  WBC 18.2*  --   RBC 4.15*  --   HGB 12.0* 8.3*  HCT 37.2* 25.1*  MCV 89.6  --   MCH 28.9  --   MCHC 32.3  --   RDW 13.4  --   PLT 249 124*   Cardiac EnzymesNo results for input(s): TROPONINI in the last 168 hours. No results for input(s): TROPIPOC in the last 168 hours.  BNPNo results for input(s): BNP, PROBNP in the last 168 hours.  DDimer No results for input(s): DDIMER in the last 168 hours.  Radiology/Studies:  Dg Chest Portable 1 View  Result Date: 2017/02/26 CLINICAL DATA:  Shortness of breath.  Left-sided lower chest pain. EXAM: PORTABLE CHEST 1 VIEW COMPARISON:  None. FINDINGS: The heart size and mediastinal contours are within normal limits. Both lungs are clear. The visualized skeletal structures are unremarkable. IMPRESSION: No active disease. Electronically Signed   By: Francene Boyers M.D.   On: Feb 26, 2017 08:39   Dg Abd Portable 1v  Result Date: 2017/02/26 CLINICAL DATA:  Shortness of breath.  Left-sided lower chest pain. EXAM: PORTABLE ABDOMEN - 1 VIEW COMPARISON:   None. FINDINGS: Left lateral decubitus view of the abdomen demonstrates no evidence of free air. The visualized bowel gas pattern is normal. No appreciable bone abnormality. IMPRESSION: Negative.  Specifically, no free air in the abdomen. Electronically Signed   By: Francene Boyers M.D.   On: 02-26-2017 08:38   Ct Angio Chest/abd/pel For Dissection W And/or Wo Contrast  Result Date: 02/12/2017 CLINICAL DATA:  Chest and abdominal pain EXAM: CT ANGIOGRAPHY CHEST, ABDOMEN AND PELVIS TECHNIQUE: Initially, axial CT images were obtained through the chest without intravenous contrast material. Multidetector CT imaging through the chest, abdomen and pelvis was performed using the standard protocol during bolus administration of intravenous contrast. Multiplanar reconstructed images and MIPs were obtained and reviewed to evaluate the vascular anatomy. CONTRAST:  100mL ISOVUE-370 IOPAMIDOL (ISOVUE-370) INJECTION 76% COMPARISON:  None. FINDINGS: CTA CHEST FINDINGS Cardiovascular: There is no intramural hematoma evident on the noncontrast enhanced study. There is no appreciable thoracic aortic aneurysm or dissection. There is extensive atherosclerotic plaque in the arch and descending thoracic aorta with multiple areas of irregularity along the wall of the descending aorta. Visualized great vessels appear unremarkable. No pulmonary embolus evident. There is no appreciable pericardial effusion or pericardial thickening. Mediastinum/Nodes: Thyroid appears unremarkable. There are scattered subcentimeter mediastinal lymph nodes. There is a lymph node in the right hilum measuring 1.5 x 1.3 cm. No esophageal lesions are appreciable. Lungs/Pleura: There is bibasilar atelectatic change. There is no frank edema or consolidation. No appreciable pleural effusion or pleural thickening. Musculoskeletal: There are no blastic or lytic bone lesions. There is degenerative change in the thoracic spine. Review of the MIP images confirms the  above findings. CTA ABDOMEN AND PELVIS FINDINGS VASCULAR Aorta: There is extensive atherosclerotic calcification in the aorta. There is aneurysmal dilatation in the distal aorta measuring approximately 9.9 x 7.7 cm. Extensive thrombus is noted throughout much of this aneurysm. No appreciable dissection. There is active extravasation of contrast from the mid abdominal aorta extending to the left. There is a large hematoma in the left retroperitoneum with active extravasation extending into this hematoma. This hematoma extends well into the left pelvis. Currently, the hematoma measures approximately 22.7 x 13.9 x 7.8 cm. Note that the area of contrast extravasation from the aorta is distal to the renal arteries. Celiac: There is approximately 90% diameter stenosis at the origin of the celiac axis. No celiac artery aneurysm or dissection. Celiac artery branches appear patent. SMA: There is moderate atherosclerotic calcification in the proximal superior mesenteric artery with approximately 50% diameter stenosis proximally. No aneurysm or dissection. Major mesenteric branches appear patent. Renals: A single renal artery is seen on the right. There is a main renal artery on the left with a tiny accessory renal artery supplying the superior left kidney. There is atherosclerotic irregularity in each proximal renal artery. There is approximately 90% diameter stenosis at the proximal aspect of the left main renal artery. No dissection in either vessel. No fibromuscular dysplasia evident. 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 Inflow: There is extensive atherosclerotic irregularity in both common iliac arteries. There are multiple areas of irregular atherosclerosis. No stenosis greater than 50% diameter is seen in these vessels. Note that there is a focal dissection in the proximal right common iliac artery extending over a distance of just over 1 cm. No other pelvic arterial dissection evident. There is  moderate atherosclerotic calcification in both common femoral arteries. There is 50-60% diameter stenosis in each distal common femoral artery. The visualized proximal superficial femoral and profunda femoral arteries are patent. Veins: No obvious venous abnormality within the limitations of this arterial phase study. Review of the MIP images confirms the above findings. NON-VASCULAR Hepatobiliary: No focal liver lesions are evident. Gallbladder wall is not appreciably thickened. There is no biliary duct dilatation. Pancreas: No pancreatic mass or inflammatory focus. Spleen: No splenic lesions are evident. Adrenals/Urinary  Tract: No adrenal lesions are evident. There is no renal mass or hydronephrosis on either side. The retroperitoneal hematoma on the left extends superiorly to impress upon the left kidney from a posterior approach. There is fluid extending into the left perinephric region from the hematoma arising from the aorta on the left side. No renal or ureteral calculus evident. Urinary bladder is midline with wall thickness within normal limits. Stomach/Bowel: There are multiple sigmoid diverticula without diverticulitis. There is no appreciable bowel wall thickening. No bowel obstruction. No free air or portal venous air. Lymphatic: Several prominent periprostatic region lymph nodes are noted, largest measuring 1.5 x 1.4 cm. No adenopathy elsewhere. Reproductive: Prostate appears normal in size and contour. The seminal vesicles appear somewhat irregular in contour and prominent. No well-defined pelvic mass. Other: No abscess or free fluid in the abdomen or pelvis noted. No periappendiceal region inflammation evident. Musculoskeletal: There is degenerative change in the lumbar spine. No blastic or lytic bone lesions are evident. No intramuscular lesion or abdominal wall lesion evident. Review of the MIP images confirms the above findings. IMPRESSION: CT angiogram chest: 1. No thoracic aortic aneurysm or  dissection. There is atherosclerotic calcification in the arch and descending aorta with multiple areas of atherosclerotic irregularity along the descending aorta. 2.  No evident pulmonary embolus. 3.  Patchy bibasilar atelectasis.  No consolidation. 4.  Mildly enlarged right hilar lymph node of uncertain etiology. 5.  No pericardial effusion or pericardial thickening. CT angiogram abdomen; CT angiogram pelvis 1. Abdominal aortic aneurysm measuring 9.9 x 7.7 cm. There is active contrast extravasation from the leftward aspect of the mid the distal aorta inferior to the renal arteries with large left retroperitoneal hematoma extending into the left pelvis. Appearance is consistent with active leakage from aortic aneurysm. Note that there is thrombus throughout much of the abdominal aortic aneurysm. 2. Extensive atherosclerotic irregularity in multiple mesenteric and pelvic arterial vessels. Short-segment dissection noted in the proximal right common iliac artery. No other dissection evident. 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 3. Prominent seminal vesicles bilaterally withe periprostatic lymph node prominence. Advise correlation with PSA when patient is clinically stable. 4. Sigmoid diverticula without diverticulitis. No bowel obstruction. No abscess. 5. Mild impression on the left kidney due to large left retroperitoneal hematoma. No hydronephrosis. No renal or ureteral calculi. Critical Value/emergent results were called by telephone at the time of interpretation on 02/08/2017 at 9:15 a.m. to Dr. Raeford Razor , who verbally acknowledged these results. Due to the severity of the findings in the abdomen, I called report to Dr. Juleen China prior to initiating the dictation. Electronically Signed   By: Bretta Bang III M.D.   On: 02/28/2017 09:32    Assessment and Plan:   1. Cardiac Arrest - PEA/VFib 2. Ruptured AAA 3. Shock, cardiogenic  Plan: emergency cath, salvage PCI, possible hemodynamic  support pending findings. Further plan pending cath results.  For questions or updates, please contact [facility] Please consult [email or web address] for contact info under Cardiology/STEMI.   Signed, Tonny Bollman, MD  02/22/2017 2:46 PM

## 2017-02-13 NOTE — Progress Notes (Signed)
RT NOTE:  ETT holder placed on patient and ETT secured 24cm @ lip.

## 2017-02-13 NOTE — Anesthesia Procedure Notes (Signed)
Central Venous Catheter Insertion Performed by: Heather RobertsSinger, Cherise Fedder, MD, anesthesiologist Start/End01/05/2017 3:43 PM, 02/13/2017 3:53 PM Patient location: OR. Preanesthetic checklist: patient identified and timeout performed Position: supine Patient sedated Hand hygiene performed  and maximum sterile barriers used  PA cath was placed.PA Cath depth:50 Procedure performed without using ultrasound guided technique. Attempts: 1 Following insertion, dressing applied and Biopatch. Post procedure assessment: blood return through all ports, free fluid flow and no air  Patient tolerated the procedure well with no immediate complications. Additional procedure comments: PAC was inserted through previously inserted cordus.

## 2017-02-13 NOTE — Progress Notes (Signed)
VVS PA at bedside - pt has no pulse in left or right leg. Brabham made aware.

## 2017-02-13 NOTE — ED Provider Notes (Signed)
[facility] Provider Note   CSN: [id number] Arrival date & time: 02/05/2017  0749     History   Chief Complaint No chief complaint on file.   HPI Dickie P [name] is a 67 y.o. male.  HPI   67 year old male with abdominal pain.  Acute onset early this morning.  Patient initially thought the pain was in his left hip to left back.  Progressed to his left lower quadrant has since become more generalized and radiating to epigastrium.  Worse with any type of movement.  Nausea, but no vomiting.  EMS reports worsening abdominal distention since they initially evaluated him.  Hypotensive with several blood pressures 80 systolic.  Reports a past history of diverticulitis.  No acute urinary complaints.  No blood in stool.  He is not anticoagulated.   Past Medical History:  Diagnosis Date  . Arthritis    knees, back  . Chondromalacia of patella    right  . Heart murmur   . Hypertension   . Snores     There are no active problems to display for this patient.   Past Surgical History:  Procedure Laterality Date  . BACK SURGERY     [facility]  . BUNIONECTOMY Left   . KNEE ARTHROSCOPY WITH MEDIAL MENISECTOMY Right 02/17/2015   Procedure: RIGHT KNEE ARTHROSCOPY CHONDROPLASTY WITH MEDIAL MENISCECTOMY;  Surgeon: Loreta Aveaniel F Murphy, MD;  Location: [facility];  Service: Orthopedics;  Laterality: Right;  . TONSILLECTOMY         Home Medications    Prior to Admission medications   Medication Sig Start Date End Date Taking? Authorizing Provider  benazepril (LOTENSIN) 20 MG tablet Take 20 mg by mouth daily.    [provider]  metoprolol (LOPRESSOR) 50 MG tablet Take 50 mg by mouth 2 (two) times daily.    [provider]  ondansetron (ZOFRAN) 4 MG tablet Take 1 tablet (4 mg total) by mouth every 8 (eight) hours as needed for nausea or vomiting. 02/17/15   Cristie HemStanbery, Mary L, PA-C    oxyCODONE-acetaminophen (ROXICET) 5-325 MG tablet Take 1-2 tablets by mouth every 4 (four) hours as needed. 02/17/15   Cristie HemStanbery, Mary L, PA-C    Family History No family history on file.  Social History Social History   Tobacco Use  . Smoking status: Current Every Day Smoker    Types: E-cigarettes  Substance Use Topics  . Alcohol use: Yes    Comment: 2xweek  . Drug use: Not on file     Allergies   Patient has no known allergies.   Review of Systems Review of Systems  All systems reviewed and negative, other than as noted in HPI.  Physical Exam Updated Vital Signs There were no vitals taken for this visit.  Physical Exam  Constitutional: He appears well-developed and well-nourished. He appears distressed.  Laying in bed.  Moaning.  Appears very uncomfortable.  HENT:  Head: Normocephalic and atraumatic.  Eyes: Conjunctivae are normal. Right eye exhibits no discharge. Left eye exhibits no discharge.  Neck: Neck supple.  Cardiovascular: Normal rate, regular rhythm and normal heart sounds. Exam reveals no gallop and no friction rub.  No murmur heard. Pulmonary/Chest: Effort normal and breath sounds normal. No respiratory distress.  Abdominal: He exhibits distension. There is tenderness. There is rebound and guarding.  Abdominal distention.  Peritonitis.  Mottled appearance of abdomen extending into the left flank.  Musculoskeletal: He exhibits no edema or  tenderness.  Neurological: He is alert.  Skin: Skin is warm and dry.  Psychiatric: He has a normal mood and affect. His behavior is normal. Thought content normal.  Nursing note and vitals reviewed.    ED Treatments / Results  Labs (all labs ordered are listed, but only abnormal results are displayed) Labs Reviewed  CBC WITH DIFFERENTIAL/PLATELET - Abnormal; Notable for the following components:      Result Value   WBC 18.2 (*)    RBC 4.15 (*)    Hemoglobin 12.0 (*)    HCT 37.2 (*)    Neutro Abs 12.7 (*)     Lymphs Abs 4.2 (*)    Monocytes Absolute 1.1 (*)    All other components within normal limits  COMPREHENSIVE METABOLIC PANEL - Abnormal; Notable for the following components:   Potassium 3.3 (*)    Glucose, Bld 278 (*)    BUN 21 (*)    Creatinine, Ser 1.67 (*)    Calcium 8.6 (*)    Total Protein 5.3 (*)    Albumin 3.0 (*)    GFR calc non Af Amer 41 (*)    GFR calc Af Amer 47 (*)    All other components within normal limits  PROTIME-INR - Abnormal; Notable for the following components:   Prothrombin Time 15.3 (*)    All other components within normal limits  LIPASE, BLOOD  APTT  URINALYSIS, ROUTINE W REFLEX MICROSCOPIC  LACTIC ACID, PLASMA  TYPE AND SCREEN  PREPARE RBC (CROSSMATCH)    EKG  EKG Interpretation  Date/Time:  Wednesday February 13 2017 07:56:43 EST Ventricular Rate:  75 PR Interval:    QRS Duration: 87 QT Interval:  383 QTC Calculation: 428 R Axis:   45 Text Interpretation:  Sinus rhythm Atrial premature complex Borderline repolarization abnormality No old tracing to compare Confirmed by Raeford Razor [phone]) on 02/28/2017 8:03:41 AM Also confirmed by Raeford Razor [phone]), editor Marbury, Tamera Punt ([id number])  on 03/05/2017 9:16:08 AM       Radiology Dg Chest Portable 1 View  Result Date: 02/11/2017 CLINICAL DATA:  Shortness of breath.  Left-sided lower chest pain. EXAM: PORTABLE CHEST 1 VIEW COMPARISON:  None. FINDINGS: The heart size and mediastinal contours are within normal limits. Both lungs are clear. The visualized skeletal structures are unremarkable. IMPRESSION: No active disease. Electronically Signed   By: Francene Boyers M.D.   On: 02/06/2017 08:39   Dg Abd Portable 1v  Result Date: 03/06/2017 CLINICAL DATA:  Shortness of breath.  Left-sided lower chest pain. EXAM: PORTABLE ABDOMEN - 1 VIEW COMPARISON:  None. FINDINGS: Left lateral decubitus view of the abdomen demonstrates no evidence of free air. The visualized bowel gas pattern is normal. No appreciable  bone abnormality. IMPRESSION: Negative.  Specifically, no free air in the abdomen. Electronically Signed   By: Francene Boyers M.D.   On: 02/24/2017 08:38    Procedures Procedures (including critical care time)  EMERGENCY DEPARTMENT ULTRASOUND  Study: Limited Retroperitoneal Ultrasound of the Abdominal Aorta.  INDICATIONS:Abdominal pain Multiple views of the abdominal aorta were obtained in real-time from the diaphragmatic hiatus to the aortic bifurcation in transverse planes with a multi-frequency probe.  PERFORMED BY: Myself IMAGES ARCHIVED?: No LIMITATIONS:  Abdominal pain INTERPRETATION:  Abdominal aortic aneurysm present - diameter dimensions >6 cm just above iliac bifurcation  CRITICAL CARE Performed by: Raeford Razor Total critical care time: 40 minutes Critical care time was exclusive of separately billable procedures and treating other patients. Critical care was necessary to  treat or prevent imminent or life-threatening deterioration. Critical care was time spent personally by me on the following activities: development of treatment plan with patient and/or surrogate as well as nursing, discussions with consultants, evaluation of patient's response to treatment, examination of patient, obtaining history from patient or surrogate, ordering and performing treatments and interventions, ordering and review of laboratory studies, ordering and review of radiographic studies, pulse oximetry and re-evaluation of patient's condition.   Medications Ordered in ED Medications  sodium chloride 0.9 % bolus 2,000 mL ( Intravenous MAR Hold 02/12/2017 0914)  fentaNYL (SUBLIMAZE) injection 50 mcg ( Intravenous MAR Hold 02/12/2017 0914)  0.9 %  sodium chloride infusion ( Intravenous MAR Hold 02/23/2017 0914)  ondansetron (ZOFRAN) 4 MG/2ML injection (not administered)  iopamidol (ISOVUE-370) 76 % injection (100 mLs  Contrast Given 02/05/2017 0845)     Initial Impression / Assessment and Plan / ED Course  I  have reviewed the triage vital signs and the nursing notes.  Pertinent labs & imaging results that were available during my care of the patient were reviewed by me and considered in my medical decision making (see chart for details).  Clinical Course as of Feb 14 903  Wed Feb 13, 2017  0759 Pt seen on arrival to the ED. Clinical concern for perforation. Kidney stone consideration, but I feel less likely with distension/peritonitis. Portable films ordered. NPO. IVF. Pain meds. Plan CT if initial films w/o free air.   [SK]  0810 Plain films look ok. On bedside US, pt has large AAA. Vascular surgery paged. BP improving with IVF but still soft and he is mottled. Will start transfusing blood.   [SK]    Clinical Course User Index [SK] Raeford Razor, MD     Final Clinical Impressions(s) / ED Diagnoses   Final diagnoses:  AAA (abdominal aortic aneurysm, ruptured) (HCC)   66yM with abdominal/flank/back pain. Bedside US showing large AAA. Clinically ruptured. Discussed with Dr Myra Gianotti, vascular surgery, who immediately evaluated pt in ED. BP improving with IVF. To CT to further evaluate anatomy then likely OR.   ED Discharge Orders    None       Raeford Razor, MD 03/05/2017 [phone]

## 2017-02-13 NOTE — ED Triage Notes (Addendum)
Pt in from home with excruciating L lower abd pain and swelling. Pt states pain began in L hip last night at 0100. Pt had loose BM at 0400. BP 86/43, 79% on RA. Placed on NRB and given 54600ml's NS en route. Pt moaning in pain, grabbing LLQ. 18G LAC

## 2017-02-13 NOTE — Addendum Note (Signed)
Addendum  created 02/17/2017 2132 by Val EagleMoser, Lafawn Lenoir, MD   Sign clinical note

## 2017-02-14 ENCOUNTER — Inpatient Hospital Stay (HOSPITAL_COMMUNITY): Payer: PPO | Admitting: Certified Registered"

## 2017-02-14 ENCOUNTER — Encounter (HOSPITAL_COMMUNITY): Admission: EM | Disposition: E | Payer: Self-pay | Source: Home / Self Care | Attending: Critical Care Medicine

## 2017-02-14 ENCOUNTER — Encounter (HOSPITAL_COMMUNITY): Payer: Self-pay | Admitting: Cardiovascular Disease

## 2017-02-14 ENCOUNTER — Inpatient Hospital Stay (HOSPITAL_COMMUNITY): Payer: PPO

## 2017-02-14 DIAGNOSIS — R570 Cardiogenic shock: Secondary | ICD-10-CM

## 2017-02-14 DIAGNOSIS — I7100 Dissection of unspecified site of aorta: Secondary | ICD-10-CM

## 2017-02-14 DIAGNOSIS — R579 Shock, unspecified: Secondary | ICD-10-CM

## 2017-02-14 DIAGNOSIS — N179 Acute kidney failure, unspecified: Secondary | ICD-10-CM

## 2017-02-14 DIAGNOSIS — I361 Nonrheumatic tricuspid (valve) insufficiency: Secondary | ICD-10-CM

## 2017-02-14 HISTORY — PX: FASCIOTOMY: SHX132

## 2017-02-14 LAB — POCT I-STAT 3, ART BLOOD GAS (G3+)
Acid-base deficit: 11 mmol/L — ABNORMAL HIGH (ref 0.0–2.0)
Acid-base deficit: 9 mmol/L — ABNORMAL HIGH (ref 0.0–2.0)
Acid-base deficit: 9 mmol/L — ABNORMAL HIGH (ref 0.0–2.0)
BICARBONATE: 17.2 mmol/L — AB (ref 20.0–28.0)
Bicarbonate: 12.9 mmol/L — ABNORMAL LOW (ref 20.0–28.0)
Bicarbonate: 18.1 mmol/L — ABNORMAL LOW (ref 20.0–28.0)
O2 SAT: 96 %
O2 SAT: 98 %
O2 Saturation: 99 %
PCO2 ART: 39 mmHg (ref 32.0–48.0)
PCO2 ART: 22.1 mmHg — AB (ref 32.0–48.0)
PH ART: 7.258 — AB (ref 7.350–7.450)
PO2 ART: 106 mmHg (ref 83.0–108.0)
Patient temperature: 36.4
Patient temperature: 36.8
TCO2: 14 mmol/L — AB (ref 22–32)
TCO2: 18 mmol/L — AB (ref 22–32)
TCO2: 19 mmol/L — AB (ref 22–32)
pCO2 arterial: 40.2 mmHg (ref 32.0–48.0)
pH, Arterial: 7.254 — ABNORMAL LOW (ref 7.350–7.450)
pH, Arterial: 7.376 (ref 7.350–7.450)
pO2, Arterial: 100 mmHg (ref 83.0–108.0)
pO2, Arterial: 148 mmHg — ABNORMAL HIGH (ref 83.0–108.0)

## 2017-02-14 LAB — SURGICAL PCR SCREEN
MRSA, PCR: NEGATIVE
STAPHYLOCOCCUS AUREUS: NEGATIVE

## 2017-02-14 LAB — CBC WITH DIFFERENTIAL/PLATELET
BASOS ABS: 0 10*3/uL (ref 0.0–0.1)
BASOS ABS: 0 10*3/uL (ref 0.0–0.1)
Basophils Absolute: 0 10*3/uL (ref 0.0–0.1)
Basophils Absolute: 0 10*3/uL (ref 0.0–0.1)
Basophils Relative: 0 %
Basophils Relative: 0 %
Basophils Relative: 0 %
Basophils Relative: 0 %
EOS ABS: 0 10*3/uL (ref 0.0–0.7)
EOS PCT: 0 %
EOS PCT: 0 %
EOS PCT: 0 %
Eosinophils Absolute: 0 10*3/uL (ref 0.0–0.7)
Eosinophils Absolute: 0 10*3/uL (ref 0.0–0.7)
Eosinophils Absolute: 0 10*3/uL (ref 0.0–0.7)
Eosinophils Relative: 0 %
HCT: 33.8 % — ABNORMAL LOW (ref 39.0–52.0)
HEMATOCRIT: 32.4 % — AB (ref 39.0–52.0)
HEMATOCRIT: 30.3 % — AB (ref 39.0–52.0)
HEMATOCRIT: 31 % — AB (ref 39.0–52.0)
Hemoglobin: 10.5 g/dL — ABNORMAL LOW (ref 13.0–17.0)
Hemoglobin: 10.7 g/dL — ABNORMAL LOW (ref 13.0–17.0)
Hemoglobin: 10.9 g/dL — ABNORMAL LOW (ref 13.0–17.0)
Hemoglobin: 11.5 g/dL — ABNORMAL LOW (ref 13.0–17.0)
LYMPHS ABS: 1 10*3/uL (ref 0.7–4.0)
LYMPHS ABS: 1.1 10*3/uL (ref 0.7–4.0)
LYMPHS ABS: 1.6 10*3/uL (ref 0.7–4.0)
LYMPHS PCT: 12 %
LYMPHS PCT: 13 %
LYMPHS PCT: 8 %
Lymphocytes Relative: 8 %
Lymphs Abs: 2 10*3/uL (ref 0.7–4.0)
MCH: 28.4 pg (ref 26.0–34.0)
MCH: 28.7 pg (ref 26.0–34.0)
MCH: 29.5 pg (ref 26.0–34.0)
MCH: 29.7 pg (ref 26.0–34.0)
MCHC: 33.6 g/dL (ref 30.0–36.0)
MCHC: 34.7 g/dL (ref 30.0–36.0)
MCHC: 34.5 g/dL (ref 30.0–36.0)
MCHC: 34 g/dL (ref 30.0–36.0)
MCV: 84.3 fL (ref 78.0–100.0)
MCV: 84.4 fL (ref 78.0–100.0)
MCV: 85.4 fL (ref 78.0–100.0)
MCV: 85.6 fL (ref 78.0–100.0)
MONO ABS: 0.7 10*3/uL (ref 0.1–1.0)
MONO ABS: 0.9 10*3/uL (ref 0.1–1.0)
MONO ABS: 1.2 10*3/uL — AB (ref 0.1–1.0)
Monocytes Absolute: 0.6 10*3/uL (ref 0.1–1.0)
Monocytes Relative: 4 %
Monocytes Relative: 5 %
Monocytes Relative: 7 %
Monocytes Relative: 8 %
NEUTROS ABS: 11.6 10*3/uL — AB (ref 1.7–7.7)
NEUTROS ABS: 11.8 10*3/uL — AB (ref 1.7–7.7)
Neutro Abs: 10.8 10*3/uL — ABNORMAL HIGH (ref 1.7–7.7)
Neutro Abs: 10.8 10*3/uL — ABNORMAL HIGH (ref 1.7–7.7)
Neutrophils Relative %: 79 %
Neutrophils Relative %: 84 %
Neutrophils Relative %: 85 %
Neutrophils Relative %: 87 %
PLATELETS: 53 10*3/uL — AB (ref 150–400)
PLATELETS: 64 10*3/uL — AB (ref 150–400)
PLATELETS: 71 10*3/uL — AB (ref 150–400)
Platelets: 59 10*3/uL — ABNORMAL LOW (ref 150–400)
RBC: 3.54 MIL/uL — AB (ref 4.22–5.81)
RBC: 3.63 MIL/uL — ABNORMAL LOW (ref 4.22–5.81)
RBC: 3.84 MIL/uL — AB (ref 4.22–5.81)
RBC: 4.01 MIL/uL — ABNORMAL LOW (ref 4.22–5.81)
RDW: 16.7 % — ABNORMAL HIGH (ref 11.5–15.5)
RDW: 16.9 % — ABNORMAL HIGH (ref 11.5–15.5)
RDW: 15.8 % — AB (ref 11.5–15.5)
RDW: 16.2 % — AB (ref 11.5–15.5)
WBC: 12.7 10*3/uL — ABNORMAL HIGH (ref 4.0–10.5)
WBC: 12.9 10*3/uL — AB (ref 4.0–10.5)
WBC: 13.3 10*3/uL — ABNORMAL HIGH (ref 4.0–10.5)
WBC: 15 10*3/uL — ABNORMAL HIGH (ref 4.0–10.5)

## 2017-02-14 LAB — RENAL FUNCTION PANEL
ALBUMIN: 2.7 g/dL — AB (ref 3.5–5.0)
Anion gap: 20 — ABNORMAL HIGH (ref 5–15)
BUN: 31 mg/dL — AB (ref 6–20)
CHLORIDE: 110 mmol/L (ref 101–111)
CO2: 12 mmol/L — AB (ref 22–32)
Calcium: 7.1 mg/dL — ABNORMAL LOW (ref 8.9–10.3)
Creatinine, Ser: 3.35 mg/dL — ABNORMAL HIGH (ref 0.61–1.24)
GFR calc Af Amer: 20 mL/min — ABNORMAL LOW (ref >60)
GFR calc non Af Amer: 18 mL/min — ABNORMAL LOW (ref >60)
GLUCOSE: 106 mg/dL — AB (ref 65–99)
PHOSPHORUS: 7.4 mg/dL — AB (ref 2.5–4.6)
POTASSIUM: 5.3 mmol/L — AB (ref 3.5–5.1)
Sodium: 142 mmol/L (ref 135–145)

## 2017-02-14 LAB — BASIC METABOLIC PANEL
ANION GAP: 18 — AB (ref 5–15)
ANION GAP: 20 — AB (ref 5–15)
Anion gap: 16 — ABNORMAL HIGH (ref 5–15)
BUN: 34 mg/dL — ABNORMAL HIGH (ref 6–20)
BUN: 32 mg/dL — ABNORMAL HIGH (ref 6–20)
BUN: 26 mg/dL — AB (ref 6–20)
CALCIUM: 7.2 mg/dL — AB (ref 8.9–10.3)
CHLORIDE: 108 mmol/L (ref 101–111)
CHLORIDE: 111 mmol/L (ref 101–111)
CO2: 18 mmol/L — ABNORMAL LOW (ref 22–32)
CO2: 14 mmol/L — ABNORMAL LOW (ref 22–32)
CO2: 13 mmol/L — ABNORMAL LOW (ref 22–32)
CREATININE: 3.41 mg/dL — AB (ref 0.61–1.24)
Calcium: 7.3 mg/dL — ABNORMAL LOW (ref 8.9–10.3)
Calcium: 7.2 mg/dL — ABNORMAL LOW (ref 8.9–10.3)
Chloride: 110 mmol/L (ref 101–111)
Creatinine, Ser: 2.61 mg/dL — ABNORMAL HIGH (ref 0.61–1.24)
Creatinine, Ser: 3.81 mg/dL — ABNORMAL HIGH (ref 0.61–1.24)
GFR calc Af Amer: 28 mL/min — ABNORMAL LOW (ref >60)
GFR calc Af Amer: 17 mL/min — ABNORMAL LOW (ref >60)
GFR calc Af Amer: 20 mL/min — ABNORMAL LOW (ref >60)
GFR calc non Af Amer: 17 mL/min — ABNORMAL LOW (ref >60)
GFR, EST NON AFRICAN AMERICAN: 24 mL/min — AB (ref >60)
GFR, EST NON AFRICAN AMERICAN: 15 mL/min — AB (ref >60)
GLUCOSE: 347 mg/dL — AB (ref 65–99)
Glucose, Bld: 229 mg/dL — ABNORMAL HIGH (ref 65–99)
Glucose, Bld: 110 mg/dL — ABNORMAL HIGH (ref 65–99)
POTASSIUM: 4.6 mmol/L (ref 3.5–5.1)
POTASSIUM: 5.2 mmol/L — AB (ref 3.5–5.1)
POTASSIUM: 5.4 mmol/L — AB (ref 3.5–5.1)
SODIUM: 143 mmol/L (ref 135–145)
SODIUM: 143 mmol/L (ref 135–145)
Sodium: 142 mmol/L (ref 135–145)

## 2017-02-14 LAB — PREPARE FRESH FROZEN PLASMA
Unit division: 0
Unit division: 0

## 2017-02-14 LAB — FIBRINOGEN
FIBRINOGEN: 245 mg/dL (ref 210–475)
FIBRINOGEN: 287 mg/dL (ref 210–475)
FIBRINOGEN: 304 mg/dL (ref 210–475)
Fibrinogen: 192 mg/dL — ABNORMAL LOW (ref 210–475)

## 2017-02-14 LAB — COMPREHENSIVE METABOLIC PANEL
ALK PHOS: 37 U/L — AB (ref 38–126)
ALT: 122 U/L — AB (ref 17–63)
AST: 262 U/L — AB (ref 15–41)
Albumin: 2.8 g/dL — ABNORMAL LOW (ref 3.5–5.0)
Anion gap: 18 — ABNORMAL HIGH (ref 5–15)
BUN: 30 mg/dL — AB (ref 6–20)
CALCIUM: 7.3 mg/dL — AB (ref 8.9–10.3)
CO2: 18 mmol/L — AB (ref 22–32)
CREATININE: 3.15 mg/dL — AB (ref 0.61–1.24)
Chloride: 108 mmol/L (ref 101–111)
GFR, EST AFRICAN AMERICAN: 22 mL/min — AB (ref >60)
GFR, EST NON AFRICAN AMERICAN: 19 mL/min — AB (ref >60)
Glucose, Bld: 355 mg/dL — ABNORMAL HIGH (ref 65–99)
Potassium: 5 mmol/L (ref 3.5–5.1)
SODIUM: 144 mmol/L (ref 135–145)
Total Bilirubin: 0.8 mg/dL (ref 0.3–1.2)
Total Protein: 4.1 g/dL — ABNORMAL LOW (ref 6.5–8.1)

## 2017-02-14 LAB — GLUCOSE, CAPILLARY
GLUCOSE-CAPILLARY: 225 mg/dL — AB (ref 65–99)
GLUCOSE-CAPILLARY: 155 mg/dL — AB (ref 65–99)
GLUCOSE-CAPILLARY: 107 mg/dL — AB (ref 65–99)
GLUCOSE-CAPILLARY: 134 mg/dL — AB (ref 65–99)
Glucose-Capillary: 169 mg/dL — ABNORMAL HIGH (ref 65–99)
Glucose-Capillary: 189 mg/dL — ABNORMAL HIGH (ref 65–99)
Glucose-Capillary: 324 mg/dL — ABNORMAL HIGH (ref 65–99)
Glucose-Capillary: 331 mg/dL — ABNORMAL HIGH (ref 65–99)
Glucose-Capillary: 84 mg/dL (ref 65–99)

## 2017-02-14 LAB — LACTIC ACID, PLASMA
LACTIC ACID, VENOUS: 10.1 mmol/L — AB (ref 0.5–1.9)
LACTIC ACID, VENOUS: 8.8 mmol/L — AB (ref 0.5–1.9)

## 2017-02-14 LAB — ECHOCARDIOGRAM COMPLETE: WEIGHTICAEL: 3392 [oz_av]

## 2017-02-14 LAB — PROTIME-INR
INR: 1.64
INR: 1.71
INR: 1.86
INR: 2.06
Prothrombin Time: 19.3 seconds — ABNORMAL HIGH (ref 11.4–15.2)
Prothrombin Time: 19.9 seconds — ABNORMAL HIGH (ref 11.4–15.2)
Prothrombin Time: 21.2 seconds — ABNORMAL HIGH (ref 11.4–15.2)
Prothrombin Time: 23 seconds — ABNORMAL HIGH (ref 11.4–15.2)

## 2017-02-14 LAB — BPAM FFP
BLOOD PRODUCT EXPIRATION DATE: 201901142359
Blood Product Expiration Date: 201901142359
ISSUE DATE / TIME: 201901090956
ISSUE DATE / TIME: 201901091046
UNIT TYPE AND RH: 8400
UNIT TYPE AND RH: 8400

## 2017-02-14 LAB — MAGNESIUM
MAGNESIUM: 2.1 mg/dL (ref 1.7–2.4)
Magnesium: 2.2 mg/dL (ref 1.7–2.4)

## 2017-02-14 LAB — PROCALCITONIN: PROCALCITONIN: 2.25 ng/mL

## 2017-02-14 LAB — TROPONIN I: Troponin I: 4.46 ng/mL (ref <0.03)

## 2017-02-14 LAB — AMYLASE: AMYLASE: 95 U/L (ref 28–100)

## 2017-02-14 LAB — PHOSPHORUS: Phosphorus: 6.9 mg/dL — ABNORMAL HIGH (ref 2.5–4.6)

## 2017-02-14 SURGERY — FASCIOTOMY, UPPER EXTREMITY
Anesthesia: General | Laterality: Left

## 2017-02-14 SURGERY — FASCIOTOMY, UPPER EXTREMITY
Anesthesia: Choice | Site: Leg Lower | Laterality: Bilateral

## 2017-02-14 MED ORDER — DEXTROSE 50 % IV SOLN: 25.0000 mL | INTRAVENOUS | Status: DC | PRN

## 2017-02-14 MED ORDER — SODIUM CHLORIDE 0.9% FLUSH
10.0000 mL | INTRAVENOUS | Status: DC | PRN
Start: 2017-02-14 — End: 2017-02-14

## 2017-02-14 MED ORDER — FENTANYL CITRATE (PF) 100 MCG/2ML IJ SOLN
50.0000 ug | Freq: Once | INTRAMUSCULAR | Status: AC
  Administered 2017-02-14: 50 ug via INTRAVENOUS

## 2017-02-14 MED ORDER — ORAL CARE MOUTH RINSE
15.0000 mL | OROMUCOSAL | Status: DC
  Administered 2017-02-14: 15 mL via OROMUCOSAL

## 2017-02-14 MED ORDER — HEPARIN SODIUM (PORCINE) 1000 UNIT/ML DIALYSIS
1000.0000 [IU] | INTRAMUSCULAR | Status: DC | PRN
  Filled 2017-02-14: qty 6

## 2017-02-14 MED ORDER — INSULIN REGULAR BOLUS VIA INFUSION
0.0000 [IU] | Freq: Three times a day (TID) | INTRAVENOUS | Status: DC
  Filled 2017-02-14: qty 10

## 2017-02-14 MED ORDER — SODIUM CHLORIDE 0.9 % FOR CRRT
INTRAVENOUS_CENTRAL | Status: DC | PRN
  Filled 2017-02-14: qty 1000

## 2017-02-14 MED ORDER — PROPOFOL 10 MG/ML IV BOLUS
INTRAVENOUS | Status: AC
  Filled 2017-02-14: qty 20

## 2017-02-14 MED ORDER — SODIUM CHLORIDE 0.9% FLUSH: 10.0000 mL | Freq: Two times a day (BID) | INTRAVENOUS | Status: DC

## 2017-02-14 MED ORDER — FENTANYL BOLUS VIA INFUSION
25.0000 ug | INTRAVENOUS | Status: DC | PRN
  Filled 2017-02-14: qty 25

## 2017-02-14 MED ORDER — CALCIUM CHLORIDE 10 % IV SOLN
INTRAVENOUS | Status: AC
  Filled 2017-02-14: qty 10

## 2017-02-14 MED ORDER — FENTANYL CITRATE (PF) 2500 MCG/50ML IJ SOLN
25.0000 ug/h | INTRAMUSCULAR | Status: DC
  Administered 2017-02-14: 100 ug/h via INTRAVENOUS
  Filled 2017-02-14: qty 50

## 2017-02-14 MED ORDER — CHLORHEXIDINE GLUCONATE 0.12% ORAL RINSE (MEDLINE KIT): 15.0000 mL | Freq: Two times a day (BID) | OROMUCOSAL | Status: DC

## 2017-02-14 MED ORDER — SODIUM BICARBONATE 8.4 % IV SOLN
INTRAVENOUS | Status: AC
  Filled 2017-02-14: qty 100

## 2017-02-14 MED ORDER — SODIUM CHLORIDE 0.9 % IV SOLN
INTRAVENOUS | Status: DC
  Administered 2017-02-14: 1.7 [IU]/h via INTRAVENOUS
  Filled 2017-02-14: qty 1

## 2017-02-14 MED ORDER — PERFLUTREN LIPID MICROSPHERE
INTRAVENOUS | Status: AC
  Filled 2017-02-14: qty 10

## 2017-02-14 MED ORDER — DEXTROSE-NACL 5-0.45 % IV SOLN: INTRAVENOUS | Status: DC

## 2017-02-14 MED ORDER — PRISMASOL BGK 4/2.5 32-4-2.5 MEQ/L IV SOLN
INTRAVENOUS | Status: DC
  Administered 2017-02-14: 13:00:00 via INTRAVENOUS_CENTRAL
  Filled 2017-02-14 (×9): qty 5000

## 2017-02-14 MED ORDER — CHLORHEXIDINE GLUCONATE CLOTH 2 % EX PADS: 6.0000 | MEDICATED_PAD | Freq: Every day | CUTANEOUS | Status: DC

## 2017-02-14 MED ORDER — EPINEPHRINE PF 1 MG/10ML IJ SOSY
PREFILLED_SYRINGE | INTRAMUSCULAR | Status: DC
Start: 2017-02-14 — End: 2017-02-14
  Filled 2017-02-14: qty 20

## 2017-02-14 MED ORDER — PROPOFOL 500 MG/50ML IV EMUL
INTRAVENOUS | Status: AC
  Administered 2017-02-14: 50 mg/kg/h
  Filled 2017-02-14: qty 50

## 2017-02-14 MED ORDER — PRISMASOL BGK 4/2.5 32-4-2.5 MEQ/L IV SOLN
INTRAVENOUS | Status: DC
  Administered 2017-02-14: 13:00:00 via INTRAVENOUS_CENTRAL
  Filled 2017-02-14 (×2): qty 5000

## 2017-02-14 MED ORDER — DOCUSATE SODIUM 50 MG/5ML PO LIQD: 100.0000 mg | Freq: Every day | ORAL | Status: DC

## 2017-02-14 MED ORDER — MIDAZOLAM HCL 2 MG/2ML IJ SOLN
INTRAMUSCULAR | Status: AC
  Filled 2017-02-14: qty 2

## 2017-02-14 MED ORDER — NOREPINEPHRINE BITARTRATE 1 MG/ML IV SOLN
0.0000 ug/min | INTRAVENOUS | Status: DC
  Administered 2017-02-14: 2 ug/min via INTRAVENOUS
  Filled 2017-02-14: qty 4

## 2017-02-14 MED ORDER — 0.9 % SODIUM CHLORIDE (POUR BTL) OPTIME
TOPICAL | Status: DC | PRN
  Administered 2017-02-14: 1000 mL

## 2017-02-14 MED ORDER — PERFLUTREN LIPID MICROSPHERE
1.0000 mL | INTRAVENOUS | Status: AC | PRN
  Administered 2017-02-14: 2 mL via INTRAVENOUS
  Filled 2017-02-14: qty 10

## 2017-02-14 MED ORDER — FENTANYL CITRATE (PF) 250 MCG/5ML IJ SOLN
INTRAMUSCULAR | Status: AC
  Filled 2017-02-14: qty 5

## 2017-02-14 MED ORDER — SODIUM CHLORIDE 0.9 % IV SOLN: INTRAVENOUS | Status: DC

## 2017-02-14 MED ORDER — PRISMASOL BGK 4/2.5 32-4-2.5 MEQ/L IV SOLN
INTRAVENOUS | Status: DC
  Administered 2017-02-14: 13:00:00 via INTRAVENOUS_CENTRAL
  Filled 2017-02-14: qty 5000

## 2017-02-14 MED FILL — Medication: Qty: 1 | Status: AC

## 2017-02-14 MED FILL — Heparin Sodium (Porcine) Inj 1000 Unit/ML: INTRAMUSCULAR | Qty: 30 | Status: AC

## 2017-02-14 MED FILL — Sodium Chloride IV Soln 0.9%: INTRAVENOUS | Qty: 1000 | Status: AC

## 2017-02-14 SURGICAL SUPPLY — 24 items
BLADE SURG 10 STRL SS (BLADE) ×3 IMPLANT
BLADE SURG 15 STRL LF DISP TIS (BLADE) ×1 IMPLANT
BLADE SURG 15 STRL SS (BLADE) ×2
CANISTER SUCT 3000ML PPV (MISCELLANEOUS) ×3 IMPLANT
CANISTER WOUND CARE 500ML ATS (WOUND CARE) ×6 IMPLANT
CONNECTOR Y ATS VAC SYSTEM (MISCELLANEOUS) ×6 IMPLANT
COVER MAYO STAND STRL (DRAPES) ×3 IMPLANT
DRAPE HALF SHEET 40X57 (DRAPES) ×3 IMPLANT
DRAPE U-SHAPE 76X120 STRL (DRAPES) ×6 IMPLANT
DRSG VAC ATS MED SENSATRAC (GAUZE/BANDAGES/DRESSINGS) ×6 IMPLANT
ELECT REM PT RETURN 9FT ADLT (ELECTROSURGICAL) ×3
ELECTRODE REM PT RTRN 9FT ADLT (ELECTROSURGICAL) ×1 IMPLANT
GAUZE SPONGE 4X4 12PLY STRL (GAUZE/BANDAGES/DRESSINGS) ×3 IMPLANT
GLOVE BIOGEL PI IND STRL 7.5 (GLOVE) ×1 IMPLANT
GLOVE BIOGEL PI INDICATOR 7.5 (GLOVE) ×2
GLOVE SURG SS PI 7.5 STRL IVOR (GLOVE) ×3 IMPLANT
GOWN STRL REUS W/ TWL XL LVL3 (GOWN DISPOSABLE) ×2 IMPLANT
GOWN STRL REUS W/TWL XL LVL3 (GOWN DISPOSABLE) ×4
NS IRRIG 1000ML POUR BTL (IV SOLUTION) ×3 IMPLANT
SPONGE LAP 18X18 X RAY DECT (DISPOSABLE) ×3 IMPLANT
TOWEL GREEN STERILE (TOWEL DISPOSABLE) ×3 IMPLANT
TUBE CONNECTING 12'X1/4 (SUCTIONS) ×1
TUBE CONNECTING 12X1/4 (SUCTIONS) ×2 IMPLANT
YANKAUER SUCT BULB TIP NO VENT (SUCTIONS) ×3 IMPLANT

## 2017-02-15 ENCOUNTER — Encounter (HOSPITAL_COMMUNITY): Payer: Self-pay | Admitting: Surgery

## 2017-02-15 LAB — TYPE AND SCREEN
ABO/RH(D): AB POS
Antibody Screen: NEGATIVE
Unit division: 0
Unit division: 0
Unit division: 0
Unit division: 0
Unit division: 0
Unit division: 0
Unit division: 0
Unit division: 0
Unit division: 0
Unit division: 0
Unit division: 0
Unit division: 0
Unit division: 0
Unit division: 0

## 2017-02-15 LAB — BPAM RBC
Blood Product Expiration Date: 201901222359
Blood Product Expiration Date: 201901282359
Blood Product Expiration Date: 201901302359
Blood Product Expiration Date: 201902012359
Blood Product Expiration Date: 201902012359
Blood Product Expiration Date: 201902012359
Blood Product Expiration Date: 201902022359
Blood Product Expiration Date: 201902022359
Blood Product Expiration Date: 201902022359
Blood Product Expiration Date: 201902022359
Blood Product Expiration Date: 201902022359
Blood Product Expiration Date: 201902022359
Blood Product Expiration Date: 201902022359
Blood Product Expiration Date: 201902072359
ISSUE DATE / TIME: 201901090922
ISSUE DATE / TIME: 201901090922
ISSUE DATE / TIME: 201901091046
ISSUE DATE / TIME: 201901091046
ISSUE DATE / TIME: 201901091350
ISSUE DATE / TIME: 201901091350
ISSUE DATE / TIME: 201901091419
ISSUE DATE / TIME: 201901091840
ISSUE DATE / TIME: 201901092142
ISSUE DATE / TIME: 201901101030
ISSUE DATE / TIME: 201901101143
Unit Type and Rh: 6200
Unit Type and Rh: 6200
Unit Type and Rh: 6200
Unit Type and Rh: 6200
Unit Type and Rh: 6200
Unit Type and Rh: 6200
Unit Type and Rh: 6200
Unit Type and Rh: 6200
Unit Type and Rh: 8400
Unit Type and Rh: 8400
Unit Type and Rh: 8400
Unit Type and Rh: 8400
Unit Type and Rh: 9500
Unit Type and Rh: 9500

## 2017-02-26 ENCOUNTER — Telehealth: Payer: Self-pay

## 2017-02-26 NOTE — Telephone Encounter (Signed)
On 02/26/17 I received a d/c from [employer] (original). The d/c is for cremation. The patient is a patient of Doctor Molli KnockYacoub. The d/[facility] will be [facility] for signature.  On 02/27/17 I received a phone call from Doctor Molli KnockYacoub stating this was not our patient. I called the funeral home and let them know this information. The funeral home told me I could shred the d/c.

## 2017-03-08 NOTE — Progress Notes (Signed)
PULMONARY / CRITICAL CARE MEDICINE   Name: Thomas Atkinson MRN: [id number] DOB: [birth year]    ADMISSION DATE:  03/01/2017 CONSULTATION DATE:  1/9  REFERRING MD:  Dr. Myra GianottiBrabham  CHIEF COMPLAINT:  shock  HISTORY OF PRESENT ILLNESS:  Patient is encephalopathic and/or intubated. Therefore history has been obtained from chart review.  67 year old male with past medical history as below, which is significant for hypertension and heart murmur.  He [facility] to [facility] department on 1/9 complaining of abdominal pain with radiation to left hip and back.  He was hypotensive as well.  Imaging in the emergency department consistent with abdominal aortic aneurysm rupture.  He was taken emergently to the operating room under Dr. Myra GianottiBrabham for repair.  Procedure was trying to and and the patient developed ST elevation and went into VF and eventually PEA arrest.  Downtime estimated at 20 minutes.  Perioperative course then complicated by ischemia of both lower extremities requiring the patient to be reopened.  Postoperatively the patient was transferred to the ICU in critical and unstable condition.  PCCM asked to evaluate  SUBJECTIVE:  Periods of hypotension and shock overnight but no further arrests  VITAL SIGNS: BP 104/83 (BP Location: Right Arm)   Pulse 91   Temp 98.8 F (37.1 C)   Resp 18   Wt 96.2 kg (212 lb)   SpO2 98%   BMI 30.42 kg/m   HEMODYNAMICS: PAP: (25-56)/(17-34) 33/23 CVP:  [0 mmHg-16 mmHg] 15 mmHg CO:  [3.1 L/min-4.3 L/min] 3.1 L/min CI:  [1.5 L/min/m2-2 L/min/m2] 1.5 L/min/m2  VENTILATOR SETTINGS: Vent Mode: Other (Comment) FiO2 (%):  [60 %-100 %] 60 % Set Rate:  [12 bmp-18 bmp] 18 bmp Vt Set:  [600 mL] 600 mL PEEP:  [5 cmH20] 5 cmH20 Pressure Support:  [10 cmH20] 10 cmH20 Plateau Pressure:  [12 cmH20-30 cmH20] 12 cmH20  INTAKE / OUTPUT: I/O last 3 completed shifts: In: 8181.2 [I.V.:4956.2; Blood:1915; NG/GT:10; IV Piggyback:1300] Out: 2280 [Urine:1130;  Emesis/NG output:50; Blood:1100]  PHYSICAL EXAMINATION: General:  Obese male, sedate on vent, not following commands Neuro:  Sedated and now paralyzed for a bedside fasciotomy, not moving any ext to commands HEENT:  [place]/AT, pupils equal, no EOM and DMM Cardiovascular:  RRR, Nl S1/S2 and -M/R/G. Lungs:  CTA bilaterally Abdomen:  Soft, distended, no BS Musculoskeletal: Post op changes noted, undergoing fasciotomy right now Skin:  Cyanotic, mottled lower extremities.   LABS:  BMET Recent Labs  Lab 2017/06/28 2024 2017/06/28 2347 03/05/2017 0410  NA 144 142 144  K 4.6 4.6 5.0  CL 108 108 108  CO2 19* 18* 18*  BUN 22* 26* 30*  CREATININE 2.15* 2.61* 3.15*  GLUCOSE 347* 347* 355*   Electrolytes Recent Labs  Lab 2017/06/28 1840 2017/06/28 2024 2017/06/28 2347 02/09/2017 0410  CALCIUM 7.6* 7.6* 7.3* 7.3*  MG 2.0  --  2.1 2.2  PHOS  --   --  6.9*  --    CBC Recent Labs  Lab 2017/06/28 1840 2017/06/28 1901 2017/06/28 2347 02/11/2017 0410  WBC 12.1*  --  12.7* 13.3*  HGB 10.7* 10.2* 11.5* 10.9*  HCT 32.6* 30.0* 33.8* 32.4*  PLT 78*  --  71* 64*   Coag's Recent Labs  Lab 2017/06/28 0810 2017/06/28 1143 2017/06/28 1840 2017/06/28 2347 03/05/2017 0410  APTT 27 >200* 50*  --   --   INR 1.22 3.84 2.05 1.71 1.64   Sepsis Markers Recent Labs  Lab 2017/06/28 0756 2017/06/28 2347  LATICACIDVEN 4.5* 8.8*  PROCALCITON  --  2.25   ABG Recent Labs  Lab 02/18/2017 2037 2017/02/21 0055 Feb 21, 2017 0427  PHART 7.266* 7.258* 7.254*  PCO2ART 40.9 40.2 39.0  PO2ART 140.0* 148.0* 100.0   Liver Enzymes Recent Labs  Lab 02/18/2017 0751 02/07/2017 2024 02-21-2017 0410  AST 23 106* 262*  ALT 21 76* 122*  ALKPHOS 56 35* 37*  BILITOT 0.9 0.8 0.8  ALBUMIN 3.0* 3.0* 2.8*   Cardiac Enzymes Recent Labs  Lab 02/28/2017 2347  TROPONINI 4.46*   Glucose Recent Labs  Lab 03/07/2017 2008 02/21/2017 0004 02/21/17 0425 February 21, 2017 0836  GLUCAP 344* 331* 324* 225*   Imaging Dg Abd 1 View  Result Date:  03/07/2017 CLINICAL DATA:  abdominal aortic aneurysm repair.  Instrument count EXAM: ABDOMEN - 1 VIEW COMPARISON:  CT 02/12/2017 FINDINGS: Midline skin staples noted. LEFT femoral central venous line present. No retained surgical instruments on imaged abdomen. Portion of the lateral RIGHT abdomen excluded. Tip of the NG tube identified. IMPRESSION: No retained surgical instruments in the field of view. Electronically Signed   By: Genevive Bi M.D.   On: 02/28/2017 16:58   Dg Chest Port 1 View  Result Date: 02-21-17 CLINICAL DATA:  Intubation. EXAM: PORTABLE CHEST 1 VIEW COMPARISON:  03/04/2017. FINDINGS: Endotracheal tube, NG tube, Swan-Ganz catheter in stable position. Swan-Ganz catheter is curled on itself. Previously identified metallic density noted over the left chest no longer identified. Heart size is stable. Interim partial clearing of bilateral from interstitial prominence suggesting partial clearing of pulmonary interstitial edema. IMPRESSION: 1.  Lines and tubes in unchanged position. 2. Interim partial clearing of pulmonary interstitial prominence consistent partial clearing of interstitial edema. Electronically Signed   By: Maisie Fus  Register   On: 02-21-2017 08:26   Dg Chest Port 1 View  Result Date: 02/18/2017 CLINICAL DATA:  Status post aortic aneurysm repair. EXAM: PORTABLE CHEST 1 VIEW COMPARISON:  02/14/2016 FINDINGS: Endotracheal tube in satisfactory position. Swan-Ganz catheter loops on itself in the region of the right atrium and terminates at the expected location of the main pulmonary trunk. Enteric catheter collimated off the image. Enlarged cardiac silhouette. Interval development of bilateral patchy alveolar opacities with central predominance. Osseous structures are without acute abnormality. Soft tissues are grossly normal. IMPRESSION: Swan-Ganz catheter loops on itself in the region of the right atrium and terminates in the expected location of the main pulmonary trunk.  Interval development of bilateral alveolar and interstitial opacities, likely representing mixed pulmonary edema. Electronically Signed   By: Ted Mcalpine M.D.   On: 02/25/2017 19:52   Dg Chest Portable 1 View  Result Date: 02/27/2017 CLINICAL DATA:  Instrument count EXAM: PORTABLE CHEST 1 VIEW COMPARISON:  03/02/2017 FINDINGS: Interstitial and alveolar airspace opacities throughout the right lung likely reflecting asymmetric edema. Mild interstitial prominence of the left lung. No pneumothorax. Stable cardiomediastinal silhouette. Swan-Ganz catheter coiled in the right atrium with the tip projecting over the right ventricular outflow tract. Endotracheal tube with the tip 6 cm above the carina. Small metallic focus projecting over the left side of the heart which may reflect some equipment superficial to the patient. Correlate with clinical exam. IMPRESSION: No radiopaque metallic foreign body. Swan-Ganz catheter coiled in the right atrium with the tip projecting over the right ventricular outflow tract. Endotracheal tube with the tip 6 cm above the carina. These results were called by telephone at the time of interpretation on 02/27/2017 at 4:57 pm to the circulating nurse, who verbally acknowledged these results. Electronically Signed   By: Elige Ko  On: 2017-02-17 17:00   Dg Abd Portable 1v  Result Date: 02/17/2017 CLINICAL DATA:  Abdominal distention status post abdominal aortic aneurysm repair. EXAM: PORTABLE ABDOMEN - 1 VIEW COMPARISON:  None. FINDINGS: The bowel gas pattern is normal. No significant gaseous distention of small nor large bowel to suggest ileus or obstruction. Gastric tube is seen in the expected location of the stomach. Parasagittal skin staples are seen along the abdomen and pelvis near midline. No radio-opaque calculi or other significant radiographic abnormality are seen. IMPRESSION: No bowel obstruction or ileus identified. Electronically Signed   By: Tollie Eth M.D.   On:  02-17-17 21:32   STUDIES:  CT angiogram chest/abdomen/pelvis 1/9> abdominal aortic aneurysm measuring 9.9 x 7.7 cm there is active contrast extravasation with large retroperitoneal hematoma stented the left pelvis.  Extensive atherosclerotic irregularity and multiple mesenteric and pelvic arterial vessels short segment dissection noted in the proximal right common iliac artery.  Mild impression on the left kidney due to large retroperitoneal hematoma.  No hydronephrosis.  CULTURES: None  ANTIBIOTICS: Periop Zinacef 1/9  SIGNIFICANT EVENTS:   LINES/TUBES: RIJ introducer 1/9>>> ETT 1/9>>> RIJ swan 1/9>>> L IJ HD catheter 1/10>>>  DISCUSSION: 67 year old male presented with abdominal pain found to have ruptured AAA.  He was taken emergently to the operating room for repair, this was complicated by STEMI and cardiac arrest.  ROSC and 20 minutes.  He then occluded both lower extremities needs to be reopened.  Postoperatively he was in shock and transferred to the ICU in critical and unstable condition.  He has since developed critical ischemia to bilateral lower extremities and profound abdominal distention.  Remains in shock.  He has had no response neurologically despite minimal sedation.  Plans of being taken back to the OR tonight by vascular surgery.  Prognosis extremely poor.  ASSESSMENT / PLAN:  PULMONARY A: Acute hypoxemic/hypercarbic mixed respiratory failure Pulmonary edema P:   Full vent support Follow ABG - vent settings adjusted Follow CXR VAP bundle Adjust vent for ABG Change to PRVC from VC  CARDIOVASCULAR A:  Cardiac arrest VF/PEA estimated 20 mins duration.  AAA rupture Cardiogenic shock Intraop STEMI Acute heart failure Critical limb ischemia bilateral lower extremities  P:  Vascular surgery  Cardiology following - appreciate input, discussed with Dr. Excell Seltzer MAP goal >43mmHg Swan in place, index of 1.5 Amiodarone infusion D/C neo Start  levo Emergency fasciotomy now  RENAL A:   AKI, will likely worsen AGMA  P:   Place HD catheter Start CRRT today per rena Replace electrolytes as indicated  Follow electrolytes, I&O  GASTROINTESTINAL A:   Abdominal distension - suspect secondary to intraabdominal hemorrhage   P:   OGT to LIS NPO Hold off TF for now  HEMATOLOGIC A:   ABLA Coagulopathy - ? DIC developing P:  Follow CBC Transfuse as indicated  INFECTIOUS A:   Periop ABX P:   Monitor for signs of infection  ENDOCRINE A:   Hyperglycemia P:   CBG monitoring and SSI  NEUROLOGIC A:   Acute metabolic +/- anoxic encephalopathy  P:   RASS goal: 0 to -1 Precedex infusion minimize as able.  Have not seen any response, concerned for poor neuro prognosis given cardiac arrest.   FAMILY  - Updates: No family to update bedside  - Inter-disciplinary family meet or Palliative Care meeting due by:  1/17  The patient is critically ill with multiple organ systems failure and requires high complexity decision making for assessment and support, frequent evaluation  and titration of therapies, application of advanced monitoring technologies and extensive interpretation of multiple databases.   Critical Care Time devoted to patient care services described in this note is  35  Minutes. This time reflects time of care of this signee Dr Koren Bound. This critical care time does not reflect procedure time, or teaching time or supervisory time of PA/NP/Med student/Med Resident etc but could involve care discussion time.  Alyson Reedy, M.D. [facility] Pulmonary/Critical Care Medicine. Pager: [phone]. After hours pager: [phone].  02/22/2017 9:37 AM

## 2017-03-08 NOTE — Progress Notes (Signed)
No BP No HR RR per ventilator only. Prounounced death at [id number] by myself and Hyiu Ksor, Charity fundraiserN. Dr. Molli KnockYacoub notified.Family at bedside.

## 2017-03-08 NOTE — Progress Notes (Addendum)
Initial Nutrition Assessment  DOCUMENTATION CODES:   Obesity unspecified  INTERVENTION:   -If unable to extubated in 48 hours, recommend:  Initiate TF via OGT with Vital High Protein at goal rate of 30 ml/h (720 ml per day) and Prostat 60 ml TID to provide 1320 kcals, 153 gm protein, 602 ml free water daily.  NUTRITION DIAGNOSIS:   Inadequate oral intake related to inability to eat as evidenced by NPO status.  GOAL:   Provide needs based on ASPEN/SCCM guidelines  MONITOR:   Vent status, Labs, Weight trends, Skin, I & O's  REASON FOR ASSESSMENT:   Ventilator    ASSESSMENT:   67 year old male presented with abdominal pain found to have ruptured AAA.  He was taken emergently to the operating room for repair, this was complicated by STEMI and cardiac arrest.  ROSC and 20 minutes.  He then occluded both lower extremities needs to be reopened.  Postoperatively he was in shock and transferred to the ICU in critical and unstable condition.  He has since developed critical ischemia to bilateral lower extremities and profound abdominal distention.  Remains in shock.  He has had no response neurologically despite minimal sedation.  Plans of being taken back to the OR tonight by vascular surgery.  Prognosis extremely poor.  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 OGT in stomach.  MV: 17.4 L/min Temp (24hrs), Avg:97.3 F (36.3 C), Min:93.4 F (34.1 C), Max:99.3 F (37.4 C)  Pt receiving nursing care at times of visits. Unable to obtain further nutrition  history or complete nutriton-focused physical exam at this time.   Per MD notes, pt with very poor prognosis.   Medications reviewed and include amiodarone, precedex, epinephrine, fentanyl, norepinephrine.   Labs reviewed: CBGS: 225-324 (insulin drip).   Diet Order:  Diet NPO time specified  EDUCATION NEEDS:   Not appropriate for education at this time  Skin:  Skin Assessment: Skin Integrity Issues: Skin Integrity Issues:: Incisions, Wound VAC Wound Vac: bilateral leg fasciotomies Incisions: lt/rt groin incision  Last BM:  PTA  Height:   Ht Readings from Last 1 Encounters:  02/17/15 5\' 10"  (1.778 m)    Weight:   Wt Readings from Last 1 Encounters:  03/03/2017 212 lb (96.2 kg)    Ideal Body Weight:  75.5 kg  BMI:  Body mass index is 30.42 kg/m.  Estimated Nutritional Needs:   Kcal:  [id number]  Protein:  > 151 grams  Fluid:  per MD    Gianina Olinde A. Mayford KnifeWilliams, RD, LDN, CDE Pager: [phone] After hours Pager: [phone]

## 2017-03-08 NOTE — Discharge Summary (Signed)
Physician Discharge Summary   Patient ID: Thomas Atkinson [id number] 67 y.o. August 16, 1950  Admit date: 03/02/2017  Discharge date and time: 08-14-2017  7:59 PM   Admitting Physician: Nada LibmanVance W Brabham, MD   Discharge Physician: same  Admission Diagnoses: SOB (shortness of breath) [R06.02] Post-operative pain [G89.18]  Admission Condition: critical  Discharged Condition: deceased  Indication for Admission: ruptured AAA  Hospital Course: [name] presented to ED with severe abd pain and was found to have ruptured 10cm AAA by CT.  He was brought emergently to the OR for open repair of ruptured AAA.  Patient experienced cardiac arrest during skin closure and underwent coronary angiography and placement of balloon pump by Dr. Excell Seltzerooper.  He then developed acute occlusion of both femoral arteries.  Dr. Myra GianottiBrabham then performed jump graft of R limb of ABF graft to R femoral artery and thrombectomy of L limb with removal of balloon pump.  He was then transferred intubated and on pressor support to the ICU in critical condition.  Critical care/ Pulmonary was consulted.  Nephrology was also consulted as patient experienced non oliguric ARF and CRRT was initiated.  POD#1 bedside bilateral 4 compartment fasciotomy was performed due to suspected compartment syndrome.  Later in the day, the patient again experienced cardiac arrest.  After about 30 mins of ACLS and after discussion between critical care team and family, the family decided to stop all resuscitation efforts.  The patient expired shortly after with TOD 16:57.  Consults: pulmonary/intensive care and Nephrology  Treatments: surgery: Dr. 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 using a 18 x 9 bifurcated graft with distal anastomosis to bilateral                          common iliac arteries                         #2: Ultrasound-guided access, right femoral artery                         #3: Abdominal aortogram                   #4: Placement of balloon occlusion device in the juxtarenal aorta                         #5: Open exposure of right common femoral artery                         #6: Right superficial femoral and popliteal artery thromboembolectomy                         #7: Right iliofemoral bypass graft                         #8: Open exposure left common femoral artery                         #9: Left iliofemoral thrombectomy and primary closure  Dr. Myra GianottiBrabham May 09, 2017 Bilateral 4 compartment fasciotomy with wound VAC placement  Dr. Excell Seltzerooper May 09, 2017 intraoperative cardiac catheterization  Discharge Exam: See progress note May 09, 2017  Disposition: 20-Expired  Signed: Emilie RutterMatthew Josel Keo 02/26/2017 1:37 PM

## 2017-03-08 NOTE — Progress Notes (Signed)
Called by eICU RN, patient is in asystolic cardiac arrest.  Evidently became very agitated and the dropped his pressure abruptly and arrested.  Originally asystole then with 30 minutes of resuscitation to PEA then junctional brady with SBP of 30 then to PEA and asystole again.  Please see code sheet for details.  During code, spoke with family, informed that we are doing all that we can but with such profound hypotension and severe cardiac disease additional resuscitation would not be helpful given yesterday and today's events.  Based on that, decision was made by the medical team to halt further resuscitative efforts.  Wife and son just wanted to be with him bedside.  Will make DNR and allow family to present with him as I do not suspect this junctional rhythm will last very long with BP of 30/18.    The patient is critically ill with multiple organ systems failure and requires high complexity decision making for assessment and support, frequent evaluation and titration of therapies, application of advanced monitoring technologies and extensive interpretation of multiple databases.   Critical Care Time devoted to patient care services described in this note is  60  Minutes. This time reflects time of care of this signee Dr Koren BoundWesam Rigby Swamy. This critical care time does not reflect procedure time, or teaching time or supervisory time of PA/NP/Med student/Med Resident etc but could involve care discussion time.  Alyson ReedyWesam G. Rethel Sebek, M.D. [facility] Pulmonary/Critical Care Medicine. Pager: [phone]. After hours pager: [phone].

## 2017-03-08 NOTE — Progress Notes (Signed)
eLink Physician-Brief Progress Note Patient Name: Delma OfficerShiles P Sweetser DOB: 07-26-1950 MRN: [id number]   Date of Service  07/18/17  HPI/Events of Note  Trop 4, LA 8  eICU Interventions  No new orders at this time     Intervention Category Evaluation Type: Other  Ashantia Amaral 07/18/17, 2:11 AM

## 2017-03-08 NOTE — Progress Notes (Signed)
CPR Note  Patient suffered a cardiac arrest, please see previous note and code sheet for details.  Made DNR and family to visit.  Epi x4 Bicarb x3 Shock x2 Ca x1 D50 x1 Insulin x10 units  Patient expired  Alyson ReedyWesam G. Yacoub, M.D. [facility] Pulmonary/Critical Care Medicine. Pager: [phone]. After hours pager: [phone].

## 2017-03-08 NOTE — Progress Notes (Signed)
CDS referral number: [id number]

## 2017-03-08 NOTE — Progress Notes (Signed)
This evening the patient experienced cardiac arrest.  ACLS was initiated and continued for about 30 minutes.  After discussion between the family and critical care team, it was decided by the family to stop all resuscitation efforts.  He was made DNR and expired shortly after.  TOD 16:57.  Emilie RutterMatthew Harriette Tovey, PA-C [facility] [phone] 02/24/2017  5:12 PM

## 2017-03-08 NOTE — Op Note (Signed)
Cardiac Cath performed in the OR 1/10. Separate note done under 'Cardiac Cath.' please see that note for details of cath and IABP placement.   Thomas Atkinson 02/15/2017 2:07 PM

## 2017-03-08 NOTE — Op Note (Signed)
    Patient name: Thomas Atkinson MRN: [id number] DOB: Nov 05, 1950 Sex: male  02/24/2017 Pre-operative Diagnosis: Bilateral compartment syndrome Post-operative diagnosis:  Same Surgeon:  Durene CalWells Kirston Luty Assistants: Lianne CureMaureen Collins Procedure:   Bilateral 4 compartment fasciotomy with wound VAC placement Anesthesia: General Blood Loss: Minimal Specimens: None  Findings: All muscle appeared viable  Indications: The patient underwent open repair of a ruptured 10 cm abdominal aortic aneurysm yesterday.  On evaluation today, the compartments in both legs appear to be somewhat tight.  I was concerned about compartment syndrome and therefore recommended fasciotomy.  This was discussed with his family at the bedside  Procedure: The procedure was done at the bedside in the ICU.  Propofol was administered for anesthesia.  On both legs, I made a medial and lateral incision with a 10 blade.  Cautery was used to divide subcutaneous tissue down to the fascia.  The anterior, lateral, and deep compartments were released.  There was bulging muscle in the anterior compartment on both sides.  All muscle appeared viable.  Wound vacs were placed and connected to suction.  There were no immediate complications.     Juleen ChinaV. Wells Mallika Sanmiguel, M.D. [facility] [place] Office: [phone] Pager:  [phone]

## 2017-03-08 NOTE — Progress Notes (Signed)
All lines and tubes removed. Handprints and lockets of hair given to family. Support given to family. Body transferred to morgue.

## 2017-03-08 NOTE — Progress Notes (Signed)
Fentanyl 220ml flushed down sink. Witnessed by Unisys CorporationHyiu Ksor, RN

## 2017-03-08 NOTE — Progress Notes (Signed)
Visited with this family along with Vernelle EmeraldChaplain Cowan to provide support during code and death.  Family is tearful but knew that this could happen.  Family wanted prayer and after prayer wanted to spend some time with the deceased and play some of his favorite music.Family left to have tender moments and to wait for other family members to arrive.    02/16/2017 1711  Clinical Encounter Type  Visited With Patient;Patient and family together  Visit Type Spiritual support;Psychological support;Death  Spiritual Encounters  Spiritual Needs Prayer;Emotional;Grief support

## 2017-03-08 NOTE — Consult Note (Signed)
Referring Provider: No ref. provider found Primary Care Physician:  Kelton Pillar, MD Primary Nephrologist:    Reason for Consultation:  Acute oliguric renal failure s/p AAA repair and ischemic lower extremity  HPI: 67 year old male with past medical history as below, which is significant for hypertension and heart murmur.  He presented to [facility] emergency department on 1/9 with ruptured AAA He had a VF/PEA arrest and was unresponsive to 20 minutes  Creatinine on admission was 1.67  Anuric renal failure creatinine  Increased to 3.15 with worsening hyperkalemia and metabolic acidosis  Oliguric/Anuric   Will start CRRT  Past Medical History:  Diagnosis Date  . Arthritis    knees, back  . Chondromalacia of patella    right  . Heart murmur   . Hypertension   . Snores     Past Surgical History:  Procedure Laterality Date  . BACK SURGERY     lumbar for spinal stenosis at [facility]  . BUNIONECTOMY Left   . KNEE ARTHROSCOPY WITH MEDIAL MENISECTOMY Right 02/17/2015   Procedure: RIGHT KNEE ARTHROSCOPY CHONDROPLASTY WITH MEDIAL MENISCECTOMY;  Surgeon: Ninetta Lights, MD;  Location: [facility];  Service: Orthopedics;  Laterality: Right;  . LEFT HEART CATH AND CORONARY ANGIOGRAPHY N/A 03/06/2017   Procedure: LEFT HEART CATH AND CORONARY ANGIOGRAPHY;  Surgeon: Sherren Mocha, MD;  Location: [facility];  Service: Cardiovascular;  Laterality: N/A;  . TONSILLECTOMY      Prior to Admission medications   Medication Sig Start Date End Date Taking? Authorizing Provider  benazepril (LOTENSIN) 20 MG tablet Take 20 mg by mouth daily.    [provider]  metoprolol (LOPRESSOR) 50 MG tablet Take 50 mg by mouth 2 (two) times daily.    [provider]  ondansetron (ZOFRAN) 4 MG tablet Take 1 tablet (4 mg total) by mouth every 8 (eight) hours as needed for nausea or vomiting. 02/17/15   Aundra Dubin, PA-C  oxyCODONE-acetaminophen (ROXICET) 5-325 MG tablet  Take 1-2 tablets by mouth every 4 (four) hours as needed. 02/17/15   Aundra Dubin, PA-C    Current Facility-Administered Medications  Medication Dose Route Frequency Provider Last Rate Last Dose  . 0.9 %  sodium chloride infusion   Intravenous Once Virgel Manifold, MD      . 0.9 %  sodium chloride infusion   Intravenous Once Duane Boston, MD      . 0.9 %  sodium chloride infusion   Intravenous Once Serafina Mitchell, MD      . 0.9 %  sodium chloride infusion   Intravenous Once Serafina Mitchell, MD      . 0.9 %  sodium chloride infusion  500 mL Intravenous Once PRN Rhyne, Samantha J, PA-C      . 0.9 %  sodium chloride infusion  250 mL Intravenous PRN Hammonds, Sharyn Blitz, MD      . acetaminophen (TYLENOL) tablet 325-650 mg  325-650 mg Oral Q4H PRN Rhyne, Samantha J, PA-C       Or  . acetaminophen (TYLENOL) suppository 325-650 mg  325-650 mg Rectal Q4H PRN Rhyne, Samantha J, PA-C      . albuterol (PROVENTIL) (2.5 MG/3ML) 0.083% nebulizer solution 2.5 mg  2.5 mg Nebulization Q2H PRN Hammonds, Sharyn Blitz, MD      . amiodarone (NEXTERONE PREMIX) 360-4.14 MG/200ML-% (1.8 mg/mL) IV infusion  30 mg/hr Intravenous Continuous Hammonds, Sharyn Blitz, MD 16.7 mL/hr at 03-03-17 0700 30 mg/hr at 03/03/17 0700  . bisacodyl (  DULCOLAX) suppository 10 mg  10 mg Rectal Daily PRN Rhyne, Samantha J, PA-C      . cefUROXime (ZINACEF) 1.5 g in dextrose 5 % 50 mL IVPB  1.5 g Intravenous Q12H Rhyne, Hulen Shouts, PA-C   Stopped at 02/12/2017 2225  . chlorhexidine gluconate (MEDLINE KIT) (PERIDEX) 0.12 % solution 15 mL  15 mL Mouth Rinse BID Hammonds, Sharyn Blitz, MD   15 mL at 14-Mar-2017 0840  . dexmedetomidine (PRECEDEX) 400 MCG/100ML (4 mcg/mL) infusion  0.4-1.2 mcg/kg/hr Intravenous Titrated Serafina Mitchell, MD   Stopped at 03/05/2017 2039  . dextrose 50 % solution 25 mL  25 mL Intravenous PRN Rhyne, Samantha J, PA-C      . docusate sodium (COLACE) capsule 100 mg  100 mg Oral Daily Rhyne, Samantha J, PA-C      . EPINEPHrine  (ADRENALIN) 4 mg in dextrose 5 % 250 mL (0.016 mg/mL) infusion  0.5-20 mcg/min Intravenous Titrated Hammonds, Sharyn Blitz, MD   Stopped at Mar 14, 2017 0600  . fentaNYL (SUBLIMAZE) injection 50 mcg  50 mcg Intravenous Q15 min PRN Hammonds, Sharyn Blitz, MD      . fentaNYL (SUBLIMAZE) injection 50 mcg  50 mcg Intravenous Q2H PRN Hammonds, Sharyn Blitz, MD   50 mcg at 14-Mar-2017 0758  . hydrALAZINE (APRESOLINE) injection 5 mg  5 mg Intravenous Q20 Min PRN Rhyne, Samantha J, PA-C      . insulin aspart (novoLOG) injection 2-6 Units  2-6 Units Subcutaneous Q4H Hammonds, Sharyn Blitz, MD   6 Units at 03/14/17 0427  . insulin regular (NOVOLIN R,HUMULIN R) 100 Units in sodium chloride 0.9 % 100 mL (1 Units/mL) infusion   Intravenous Continuous Rhyne, Samantha J, PA-C 1.7 mL/hr at 2017-03-14 0827 1.7 Units/hr at Mar 14, 2017 0827  . insulin regular bolus via infusion 0-10 Units  0-10 Units Intravenous TID WC Rhyne, Samantha J, PA-C      . labetalol (NORMODYNE,TRANDATE) injection 10 mg  10 mg Intravenous Q10 min PRN Rhyne, Samantha J, PA-C      . lactated ringers infusion   Intravenous Continuous Hammonds, Sharyn Blitz, MD 20 mL/hr at 14-Mar-2017 0000    . magnesium sulfate IVPB 2 g 50 mL  2 g Intravenous Once PRN Rhyne, Samantha J, PA-C      . MEDLINE mouth rinse  15 mL Mouth Rinse QID Hammonds, Sharyn Blitz, MD   15 mL at 03/14/2017 0400  . metoprolol tartrate (LOPRESSOR) injection 2-5 mg  2-5 mg Intravenous Q2H PRN Rhyne, Samantha J, PA-C      . midazolam (VERSED) injection 2 mg  2 mg Intravenous Q15 min PRN Hammonds, Sharyn Blitz, MD      . midazolam (VERSED) injection 2 mg  2 mg Intravenous Q2H PRN Hammonds, Sharyn Blitz, MD      . norepinephrine (LEVOPHED) 4 mg in dextrose 5 % 250 mL (0.016 mg/mL) infusion  0-40 mcg/min Intravenous Titrated Rush Farmer, MD      . ondansetron (ZOFRAN) injection 4 mg  4 mg Intravenous Q6H PRN Rhyne, Samantha J, PA-C      . pantoprazole (PROTONIX) injection 40 mg  40 mg Intravenous QHS Rhyne, Samantha  J, PA-C   40 mg at 03/02/2017 2158  . PERFLUTREN LIPID MICROSPHERE injection SUSP           . perflutren lipid microspheres (DEFINITY) IV suspension  1-10 mL Intravenous PRN Serafina Mitchell, MD   2 mL at 03-14-17 0913  . propofol (DIPRIVAN) 500 MG/50ML infusion  Allergies as of 02/23/2017  . (No Known Allergies)    No family history on file.  Social History   Socioeconomic History  . Marital status: Married    Spouse name: Not on file  . Number of children: Not on file  . Years of education: Not on file  . Highest education level: Not on file  Social Needs  . Financial resource strain: Not on file  . Food insecurity - worry: Not on file  . Food insecurity - inability: Not on file  . Transportation needs - medical: Not on file  . Transportation needs - non-medical: Not on file  Occupational History  . Not on file  Tobacco Use  . Smoking status: Current Every Day Smoker    Types: E-cigarettes  . Smokeless tobacco: Never Used  Substance and Sexual Activity  . Alcohol use: Yes    Comment: 2xweek  . Drug use: No  . Sexual activity: Not on file  Other Topics Concern  . Not on file  Social History Narrative  . Not on file    Review of Systems: Intubated and no review of systems verbalized  Physical Exam: Vital signs in last 24 hours: Temp:  [93.4 F (34.1 C)-99.3 F (37.4 C)] 98.8 F (37.1 C) (01/10 0800) Pulse Rate:  [0-113] 91 (01/10 0800) Resp:  [0-33] 18 (01/10 0800) BP: (86-131)/(69-98) 104/83 (01/10 0800) SpO2:  [0 %-100 %] 98 % (01/10 0807) Arterial Line BP: (91-165)/(58-95) 99/66 (01/10 0800) FiO2 (%):  [60 %-100 %] 60 % (01/10 0807) Weight:  [212 lb (96.2 kg)] 212 lb (96.2 kg) (01/10 0600) Last BM Date: (Prior to surgery) General:   Alert,  Well-developed, well-nourished, pleasant and cooperative in NAD Head:  Normocephalic and atraumatic. Eyes:  Sclera clear, no icterus.   Conjunctiva pink. Ears:  Normal auditory acuity. Nose:  No  deformity, discharge,  or lesions. Mouth:  No deformity or lesions, dentition normal. Neck:  Supple; no masses or thyromegaly. JVP not elevated Lungs: Ventilator  Heart:  Regular rate and rhythm; no murmurs, clicks, rubs,  or gallops. Abdomen:  Soft, nontender and nondistended. No masses, hepatosplenomegaly or hernias noted. Normal bowel sounds, without guarding, and without rebound.   Msk:  Symmetrical without gross deformities. Normal posture. Pulses:  No carotid, renal, femoral bruits. DP and PT symmetrical and equal Extremities:  Mottled lower extremities Neurologic: Sedated and not responsive Skin:  Intact without significant lesions or rashes. Cervical Nodes:  No significant cervical adenopathy.   Intake/Output from previous day: 01/09 0701 - 01/10 0700 In: 8181.2 [I.V.:4956.2; Blood:1915; NG/GT:10; IV Piggyback:1300] Out: 2280 [Urine:1130; Emesis/NG output:50; Blood:1100] Intake/Output this shift: No intake/output data recorded.  Lab Results: Recent Labs    02/18/2017 1840 02/28/2017 1901 02/19/2017 2347 03/09/17 0410  WBC 12.1*  --  12.7* 13.3*  HGB 10.7* 10.2* 11.5* 10.9*  HCT 32.6* 30.0* 33.8* 32.4*  PLT 78*  --  71* 64*   BMET Recent Labs    02/10/2017 2024 02/17/2017 2347 03-09-2017 0410  NA 144 142 144  K 4.6 4.6 5.0  CL 108 108 108  CO2 19* 18* 18*  GLUCOSE 347* 347* 355*  BUN 22* 26* 30*  CREATININE 2.15* 2.61* 3.15*  CALCIUM 7.6* 7.3* 7.3*  PHOS  --  6.9*  --    LFT Recent Labs    03/09/17 0410  PROT 4.1*  ALBUMIN 2.8*  AST 262*  ALT 122*  ALKPHOS 37*  BILITOT 0.8   PT/INR Recent Labs    02/26/2017  2347 03-01-2017 0410  LABPROT 19.9* 19.3*  INR 1.71 1.64   Hepatitis Panel No results for input(s): HEPBSAG, HCVAB, HEPAIGM, HEPBIGM in the last 72 hours.  Studies/Results: Dg Abd 1 View  Result Date: 02/16/2017 CLINICAL DATA:  abdominal aortic aneurysm repair.  Instrument count EXAM: ABDOMEN - 1 VIEW COMPARISON:  CT 02/15/2017 FINDINGS: Midline skin  staples noted. LEFT femoral central venous line present. No retained surgical instruments on imaged abdomen. Portion of the lateral RIGHT abdomen excluded. Tip of the NG tube identified. IMPRESSION: No retained surgical instruments in the field of view. Electronically Signed   By: Suzy Bouchard M.D.   On: 02/21/2017 16:58   Dg Chest Port 1 View  Result Date: 2017-03-01 CLINICAL DATA:  Intubation. EXAM: PORTABLE CHEST 1 VIEW COMPARISON:  03/02/2017. FINDINGS: Endotracheal tube, NG tube, Swan-Ganz catheter in stable position. Swan-Ganz catheter is curled on itself. Previously identified metallic density noted over the left chest no longer identified. Heart size is stable. Interim partial clearing of bilateral from interstitial prominence suggesting partial clearing of pulmonary interstitial edema. IMPRESSION: 1.  Lines and tubes in unchanged position. 2. Interim partial clearing of pulmonary interstitial prominence consistent partial clearing of interstitial edema. Electronically Signed   By: Marcello Moores  Register   On: Mar 01, 2017 08:26   Dg Chest Port 1 View  Result Date: 02/16/2017 CLINICAL DATA:  Status post aortic aneurysm repair. EXAM: PORTABLE CHEST 1 VIEW COMPARISON:  02/14/2016 FINDINGS: Endotracheal tube in satisfactory position. Swan-Ganz catheter loops on itself in the region of the right atrium and terminates at the expected location of the main pulmonary trunk. Enteric catheter collimated off the image. Enlarged cardiac silhouette. Interval development of bilateral patchy alveolar opacities with central predominance. Osseous structures are without acute abnormality. Soft tissues are grossly normal. IMPRESSION: Swan-Ganz catheter loops on itself in the region of the right atrium and terminates in the expected location of the main pulmonary trunk. Interval development of bilateral alveolar and interstitial opacities, likely representing mixed pulmonary edema. Electronically Signed   By: Fidela Salisbury M.D.   On: 02/22/2017 19:52   Dg Chest Portable 1 View  Result Date: 03/06/2017 CLINICAL DATA:  Instrument count EXAM: PORTABLE CHEST 1 VIEW COMPARISON:  02/15/2017 FINDINGS: Interstitial and alveolar airspace opacities throughout the right lung likely reflecting asymmetric edema. Mild interstitial prominence of the left lung. No pneumothorax. Stable cardiomediastinal silhouette. Swan-Ganz catheter coiled in the right atrium with the tip projecting over the right ventricular outflow tract. Endotracheal tube with the tip 6 cm above the carina. Small metallic focus projecting over the left side of the heart which may reflect some equipment superficial to the patient. Correlate with clinical exam. IMPRESSION: No radiopaque metallic foreign body. Swan-Ganz catheter coiled in the right atrium with the tip projecting over the right ventricular outflow tract. Endotracheal tube with the tip 6 cm above the carina. These results were called by telephone at the time of interpretation on 02/27/2017 at 4:57 pm to the circulating nurse, who verbally acknowledged these results. Electronically Signed   By: Kathreen Devoid   On: 03/01/2017 17:00   Dg Chest Portable 1 View  Result Date: 02/28/2017 CLINICAL DATA:  Shortness of breath.  Left-sided lower chest pain. EXAM: PORTABLE CHEST 1 VIEW COMPARISON:  None. FINDINGS: The heart size and mediastinal contours are within normal limits. Both lungs are clear. The visualized skeletal structures are unremarkable. IMPRESSION: No active disease. Electronically Signed   By: Lorriane Shire M.D.   On: 03/05/2017 08:39  Dg Abd Portable 1v  Result Date: 02/24/2017 CLINICAL DATA:  Abdominal distention status post abdominal aortic aneurysm repair. EXAM: PORTABLE ABDOMEN - 1 VIEW COMPARISON:  None. FINDINGS: The bowel gas pattern is normal. No significant gaseous distention of small nor large bowel to suggest ileus or obstruction. Gastric tube is seen in the expected location of the  stomach. Parasagittal skin staples are seen along the abdomen and pelvis near midline. No radio-opaque calculi or other significant radiographic abnormality are seen. IMPRESSION: No bowel obstruction or ileus identified. Electronically Signed   By: Ashley Royalty M.D.   On: 02/12/2017 21:32   Dg Abd Portable 1v  Result Date: 03/04/2017 CLINICAL DATA:  Shortness of breath.  Left-sided lower chest pain. EXAM: PORTABLE ABDOMEN - 1 VIEW COMPARISON:  None. FINDINGS: Left lateral decubitus view of the abdomen demonstrates no evidence of free air. The visualized bowel gas pattern is normal. No appreciable bone abnormality. IMPRESSION: Negative.  Specifically, no free air in the abdomen. Electronically Signed   By: Lorriane Shire M.D.   On: 02/19/2017 08:38   Ct Angio Chest/abd/pel For Dissection W And/or Wo Contrast  Result Date: 02/06/2017 CLINICAL DATA:  Chest and abdominal pain EXAM: CT ANGIOGRAPHY CHEST, ABDOMEN AND PELVIS TECHNIQUE: Initially, axial CT images were obtained through the chest without intravenous contrast material. Multidetector CT imaging through the chest, abdomen and pelvis was performed using the standard protocol during bolus administration of intravenous contrast. Multiplanar reconstructed images and MIPs were obtained and reviewed to evaluate the vascular anatomy. CONTRAST:  137m ISOVUE-370 IOPAMIDOL (ISOVUE-370) INJECTION 76% COMPARISON:  None. FINDINGS: CTA CHEST FINDINGS Cardiovascular: There is no intramural hematoma evident on the noncontrast enhanced study. There is no appreciable thoracic aortic aneurysm or dissection. There is extensive atherosclerotic plaque in the arch and descending thoracic aorta with multiple areas of irregularity along the wall of the descending aorta. Visualized great vessels appear unremarkable. No pulmonary embolus evident. There is no appreciable pericardial effusion or pericardial thickening. Mediastinum/Nodes: Thyroid appears unremarkable. There are  scattered subcentimeter mediastinal lymph nodes. There is a lymph node in the right hilum measuring 1.5 x 1.3 cm. No esophageal lesions are appreciable. Lungs/Pleura: There is bibasilar atelectatic change. There is no frank edema or consolidation. No appreciable pleural effusion or pleural thickening. Musculoskeletal: There are no blastic or lytic bone lesions. There is degenerative change in the thoracic spine. Review of the MIP images confirms the above findings. CTA ABDOMEN AND PELVIS FINDINGS VASCULAR Aorta: There is extensive atherosclerotic calcification in the aorta. There is aneurysmal dilatation in the distal aorta measuring approximately 9.9 x 7.7 cm. Extensive thrombus is noted throughout much of this aneurysm. No appreciable dissection. There is active extravasation of contrast from the mid abdominal aorta extending to the left. There is a large hematoma in the left retroperitoneum with active extravasation extending into this hematoma. This hematoma extends well into the left pelvis. Currently, the hematoma measures approximately 22.7 x 13.9 x 7.8 cm. Note that the area of contrast extravasation from the aorta is distal to the renal arteries. Celiac: There is approximately 90% diameter stenosis at the origin of the celiac axis. No celiac artery aneurysm or dissection. Celiac artery branches appear patent. SMA: There is moderate atherosclerotic calcification in the proximal superior mesenteric artery with approximately 50% diameter stenosis proximally. No aneurysm or dissection. Major mesenteric branches appear patent. Renals: A single renal artery is seen on the right. There is a main renal artery on the left with a tiny accessory renal artery  supplying the superior left kidney. There is atherosclerotic irregularity in each proximal renal artery. There is approximately 90% diameter stenosis at the proximal aspect of the left main renal artery. No dissection in either vessel. No fibromuscular dysplasia  evident. 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 Inflow: There is extensive atherosclerotic irregularity in both common iliac arteries. There are multiple areas of irregular atherosclerosis. No stenosis greater than 50% diameter is seen in these vessels. Note that there is a focal dissection in the proximal right common iliac artery extending over a distance of just over 1 cm. No other pelvic arterial dissection evident. There is moderate atherosclerotic calcification in both common femoral arteries. There is 50-60% diameter stenosis in each distal common femoral artery. The visualized proximal superficial femoral and profunda femoral arteries are patent. Veins: No obvious venous abnormality within the limitations of this arterial phase study. Review of the MIP images confirms the above findings. NON-VASCULAR Hepatobiliary: No focal liver lesions are evident. Gallbladder wall is not appreciably thickened. There is no biliary duct dilatation. Pancreas: No pancreatic mass or inflammatory focus. Spleen: No splenic lesions are evident. Adrenals/Urinary Tract: No adrenal lesions are evident. There is no renal mass or hydronephrosis on either side. The retroperitoneal hematoma on the left extends superiorly to impress upon the left kidney from a posterior approach. There is fluid extending into the left perinephric region from the hematoma arising from the aorta on the left side. No renal or ureteral calculus evident. Urinary bladder is midline with wall thickness within normal limits. Stomach/Bowel: There are multiple sigmoid diverticula without diverticulitis. There is no appreciable bowel wall thickening. No bowel obstruction. No free air or portal venous air. Lymphatic: Several prominent periprostatic region lymph nodes are noted, largest measuring 1.5 x 1.4 cm. No adenopathy elsewhere. Reproductive: Prostate appears normal in size and contour. The seminal vesicles appear somewhat irregular in  contour and prominent. No well-defined pelvic mass. Other: No abscess or free fluid in the abdomen or pelvis noted. No periappendiceal region inflammation evident. Musculoskeletal: There is degenerative change in the lumbar spine. No blastic or lytic bone lesions are evident. No intramuscular lesion or abdominal wall lesion evident. Review of the MIP images confirms the above findings. IMPRESSION: CT angiogram chest: 1. No thoracic aortic aneurysm or dissection. There is atherosclerotic calcification in the arch and descending aorta with multiple areas of atherosclerotic irregularity along the descending aorta. 2.  No evident pulmonary embolus. 3.  Patchy bibasilar atelectasis.  No consolidation. 4.  Mildly enlarged right hilar lymph node of uncertain etiology. 5.  No pericardial effusion or pericardial thickening. CT angiogram abdomen; CT angiogram pelvis 1. Abdominal aortic aneurysm measuring 9.9 x 7.7 cm. There is active contrast extravasation from the leftward aspect of the mid the distal aorta inferior to the renal arteries with large left retroperitoneal hematoma extending into the left pelvis. Appearance is consistent with active leakage from aortic aneurysm. Note that there is thrombus throughout much of the abdominal aortic aneurysm. 2. Extensive atherosclerotic irregularity in multiple mesenteric and pelvic arterial vessels. Short-segment dissection noted in the proximal right common iliac artery. No other dissection evident. 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 3. Prominent seminal vesicles bilaterally withe periprostatic lymph node prominence. Advise correlation with PSA when patient is clinically stable. 4. Sigmoid diverticula without diverticulitis. No bowel obstruction. No abscess. 5. Mild impression on the left kidney due to large left retroperitoneal hematoma. No hydronephrosis. No renal or ureteral calculi. Critical Value/emergent results were called by  telephone at the time of  interpretation on 02/24/2017 at 9:15 a.m. to Dr. Virgel Manifold , who verbally acknowledged these results. Due to the severity of the findings in the abdomen, I called report to Dr. Wilson Singer prior to initiating the dictation. Electronically Signed   By: Lowella Grip III M.D.   On: 02/21/2017 09:32    Assessment/Plan:  Acute non oliguric renal failure with an increased creatinine and worsening metabolic profile  Will initiate CRRT  Anemia will follow   Hypotension phenylephrine and Epi and Norepi   Perioperative antibiotics     LOS: 1 Johnathan Heskett W _0 _1 :24 AM

## 2017-03-08 NOTE — Progress Notes (Signed)
Patient was more agitated/restless then decrease BP even with increasing Levophed. At 1617 PEA arrest. Code Called. See code sheet. Dr. Molli KnockYacoub in room. Family now at bedside with comfort care.

## 2017-03-08 NOTE — Progress Notes (Signed)
Lactic Acid Level 10.1 received from lab. Dr. Molli KnockYacoub notified.

## 2017-03-08 NOTE — Procedures (Signed)
Central Venous Trilaysis Catheter Insertion Procedure Note Delma OfficerShiles P Stofko [id number] [birth year]  Procedure: Insertion of Central Venous Trialysis Catheter Indications: Renal failure  Procedure Details Consent: Risks of procedure as well as the alternatives and risks of each were explained to the (patient/caregiver).  Consent for procedure obtained. Time Out: Verified patient identification, verified procedure, site/side was marked, verified correct patient position, special equipment/implants available, medications/allergies/relevent history reviewed, required imaging and test results available.  Performed  Maximum sterile technique was used including antiseptics, cap, gloves, gown, hand hygiene, mask and sheet. Skin prep: Chlorhexidine; local anesthetic administered A antimicrobial bonded/coated triple lumen catheter was placed in the left internal jugular vein using the Seldinger technique.  Evaluation Blood flow good Complications: No apparent complications Patient did tolerate procedure well. Chest X-ray ordered to verify placement.  CXR: pending.  U/S used in placement.  YACOUB,WESAM 09-17-17, 11:42 AM

## 2017-03-08 NOTE — Progress Notes (Addendum)
Progress Note  Patient Name: Thomas Atkinson Date of Encounter: 20-Feb-2017  Primary Cardiologist: No primary care provider on file.   Subjective   Intubated, sedated, responsive to pain stimuli  Inpatient Medications    Scheduled Meds: . chlorhexidine gluconate (MEDLINE KIT)  15 mL Mouth Rinse BID  . docusate sodium  100 mg Oral Daily  . insulin aspart  2-6 Units Subcutaneous Q4H  . insulin regular  0-10 Units Intravenous TID WC  . mouth rinse  15 mL Mouth Rinse QID  . pantoprazole (PROTONIX) IV  40 mg Intravenous QHS  . perflutren lipid microspheres (DEFINITY) IV suspension       Continuous Infusions: . sodium chloride    . sodium chloride    . sodium chloride    . sodium chloride    . sodium chloride    . sodium chloride    . amiodarone 30 mg/hr (02-20-17 0700)  . cefUROXime (ZINACEF)  IV Stopped (03/03/2017 2225)  . dexmedetomidine (PRECEDEX) IV infusion Stopped (02/15/2017 2039)  . epinephrine Stopped (20-Feb-2017 0600)  . insulin (NOVOLIN-R) infusion 1.7 Units/hr (02/20/17 0827)  . lactated ringers 20 mL/hr at 02-20-2017 0000  . magnesium sulfate 1 - 4 g bolus IVPB    . phenylephrine (NEO-SYNEPHRINE) Adult infusion 10 mcg/min (Feb 20, 2017 0700)   PRN Meds: sodium chloride, sodium chloride, acetaminophen **OR** acetaminophen, albuterol, bisacodyl, dextrose, fentaNYL (SUBLIMAZE) injection, fentaNYL (SUBLIMAZE) injection, hydrALAZINE, labetalol, magnesium sulfate 1 - 4 g bolus IVPB, metoprolol tartrate, midazolam, midazolam, ondansetron   Vital Signs    Vitals:   02/20/17 0700 02-20-2017 0715 20-Feb-2017 0800 February 20, 2017 0807  BP: 99/77 111/83 104/83   Pulse: 100 98 91   Resp: 18 18 18    Temp: 98.8 F (37.1 C) 98.8 F (37.1 C) 98.8 F (37.1 C)   TempSrc:      SpO2: 98% 98% 100% 98%  Weight:        Intake/Output Summary (Last 24 hours) at February 20, 2017 0908 Last data filed at 2017/02/20 0700 Gross per 24 hour  Intake 8181.2 ml  Output 2280 ml  Net 5901.2 ml   Filed Weights     02/11/2017 0811 2017-02-20 0600  Weight: 208 lb (94.3 kg) 212 lb (96.2 kg)    Telemetry    Sinus rhythm - Personally Reviewed  ECG    NSR with prolonged QT, nonspecific T wave abnormality - Personally Reviewed  Physical Exam  Intubated, sedated, edematous GEN: No acute distress.   Neck: moderate JVD Cardiac: RRR, no murmurs, rubs, or gallops. Distant heart sounds Respiratory: Clear to auscultation bilaterally. GI: Soft, dressing in place over midline abdomen MS: edema, tenderness left calf Neuro:  Nonfocal  Skin: some mottling improved from yesterday  Labs    Chemistry Recent Labs  Lab 02/27/2017 0751  02/10/2017 2024 02/19/2017 2347 02-20-17 0410  NA 139   < > 144 142 144  K 3.3*   < > 4.6 4.6 5.0  CL 107   < > 108 108 108  CO2 23   < > 19* 18* 18*  GLUCOSE 278*   < > 347* 347* 355*  BUN 21*   < > 22* 26* 30*  CREATININE 1.67*   < > 2.15* 2.61* 3.15*  CALCIUM 8.6*   < > 7.6* 7.3* 7.3*  PROT 5.3*  --  4.2*  --  4.1*  ALBUMIN 3.0*  --  3.0*  --  2.8*  AST 23  --  106*  --  262*  ALT 21  --  76*  --  122*  ALKPHOS 56  --  35*  --  37*  BILITOT 0.9  --  0.8  --  0.8  GFRNONAA 41*   < > 30* 24* 19*  GFRAA 47*   < > 35* 28* 22*  ANIONGAP 9   < > 17* 16* 18*   < > = values in this interval not displayed.     Hematology Recent Labs  Lab 02/05/2017 1840 02/17/2017 1901 02/19/2017 2347 Feb 16, 2017 0410  WBC 12.1*  --  12.7* 13.3*  RBC 3.78*  --  4.01* 3.84*  HGB 10.7* 10.2* 11.5* 10.9*  HCT 32.6* 30.0* 33.8* 32.4*  MCV 86.2  --  84.3 84.4  MCH 28.3  --  28.7 28.4  MCHC 32.8  --  34.0 33.6  RDW 15.4  --  15.8* 16.2*  PLT 78*  --  71* 64*    Cardiac Enzymes Recent Labs  Lab 02/21/2017 2347  TROPONINI 4.46*   No results for input(s): TROPIPOC in the last 168 hours.   BNPNo results for input(s): BNP, PROBNP in the last 168 hours.   DDimer No results for input(s): DDIMER in the last 168 hours.   Radiology    Dg Abd 1 View  Result Date: 02/15/2017 CLINICAL DATA:   abdominal aortic aneurysm repair.  Instrument count EXAM: ABDOMEN - 1 VIEW COMPARISON:  CT 02/10/2017 FINDINGS: Midline skin staples noted. LEFT femoral central venous line present. No retained surgical instruments on imaged abdomen. Portion of the lateral RIGHT abdomen excluded. Tip of the NG tube identified. IMPRESSION: No retained surgical instruments in the field of view. Electronically Signed   By: Suzy Bouchard M.D.   On: 02/07/2017 16:58   Dg Chest Port 1 View  Result Date: 2017-02-16 CLINICAL DATA:  Intubation. EXAM: PORTABLE CHEST 1 VIEW COMPARISON:  02/12/2017. FINDINGS: Endotracheal tube, NG tube, Swan-Ganz catheter in stable position. Swan-Ganz catheter is curled on itself. Previously identified metallic density noted over the left chest no longer identified. Heart size is stable. Interim partial clearing of bilateral from interstitial prominence suggesting partial clearing of pulmonary interstitial edema. IMPRESSION: 1.  Lines and tubes in unchanged position. 2. Interim partial clearing of pulmonary interstitial prominence consistent partial clearing of interstitial edema. Electronically Signed   By: Marcello Moores  Register   On: 02-16-17 08:26   Dg Chest Port 1 View  Result Date: 02/21/2017 CLINICAL DATA:  Status post aortic aneurysm repair. EXAM: PORTABLE CHEST 1 VIEW COMPARISON:  02/14/2016 FINDINGS: Endotracheal tube in satisfactory position. Swan-Ganz catheter loops on itself in the region of the right atrium and terminates at the expected location of the main pulmonary trunk. Enteric catheter collimated off the image. Enlarged cardiac silhouette. Interval development of bilateral patchy alveolar opacities with central predominance. Osseous structures are without acute abnormality. Soft tissues are grossly normal. IMPRESSION: Swan-Ganz catheter loops on itself in the region of the right atrium and terminates in the expected location of the main pulmonary trunk. Interval development of  bilateral alveolar and interstitial opacities, likely representing mixed pulmonary edema. Electronically Signed   By: Fidela Salisbury M.D.   On: 02/23/2017 19:52   Dg Chest Portable 1 View  Result Date: 03/06/2017 CLINICAL DATA:  Instrument count EXAM: PORTABLE CHEST 1 VIEW COMPARISON:  02/06/2017 FINDINGS: Interstitial and alveolar airspace opacities throughout the right lung likely reflecting asymmetric edema. Mild interstitial prominence of the left lung. No pneumothorax. Stable cardiomediastinal silhouette. Swan-Ganz catheter coiled in the right atrium with the tip projecting over  the right ventricular outflow tract. Endotracheal tube with the tip 6 cm above the carina. Small metallic focus projecting over the left side of the heart which may reflect some equipment superficial to the patient. Correlate with clinical exam. IMPRESSION: No radiopaque metallic foreign body. Swan-Ganz catheter coiled in the right atrium with the tip projecting over the right ventricular outflow tract. Endotracheal tube with the tip 6 cm above the carina. These results were called by telephone at the time of interpretation on 03/03/2017 at 4:57 pm to the circulating nurse, who verbally acknowledged these results. Electronically Signed   By: Kathreen Devoid   On: 02/20/2017 17:00   Dg Chest Portable 1 View  Result Date: 02/16/2017 CLINICAL DATA:  Shortness of breath.  Left-sided lower chest pain. EXAM: PORTABLE CHEST 1 VIEW COMPARISON:  None. FINDINGS: The heart size and mediastinal contours are within normal limits. Both lungs are clear. The visualized skeletal structures are unremarkable. IMPRESSION: No active disease. Electronically Signed   By: Lorriane Shire M.D.   On: 02/12/2017 08:39   Dg Abd Portable 1v  Result Date: 03/07/2017 CLINICAL DATA:  Abdominal distention status post abdominal aortic aneurysm repair. EXAM: PORTABLE ABDOMEN - 1 VIEW COMPARISON:  None. FINDINGS: The bowel gas pattern is normal. No significant  gaseous distention of small nor large bowel to suggest ileus or obstruction. Gastric tube is seen in the expected location of the stomach. Parasagittal skin staples are seen along the abdomen and pelvis near midline. No radio-opaque calculi or other significant radiographic abnormality are seen. IMPRESSION: No bowel obstruction or ileus identified. Electronically Signed   By: Ashley Royalty M.D.   On: 03/02/2017 21:32   Dg Abd Portable 1v  Result Date: 02/20/2017 CLINICAL DATA:  Shortness of breath.  Left-sided lower chest pain. EXAM: PORTABLE ABDOMEN - 1 VIEW COMPARISON:  None. FINDINGS: Left lateral decubitus view of the abdomen demonstrates no evidence of free air. The visualized bowel gas pattern is normal. No appreciable bone abnormality. IMPRESSION: Negative.  Specifically, no free air in the abdomen. Electronically Signed   By: Lorriane Shire M.D.   On: 02/07/2017 08:38   Ct Angio Chest/abd/pel For Dissection W And/or Wo Contrast  Result Date: 02/21/2017 CLINICAL DATA:  Chest and abdominal pain EXAM: CT ANGIOGRAPHY CHEST, ABDOMEN AND PELVIS TECHNIQUE: Initially, axial CT images were obtained through the chest without intravenous contrast material. Multidetector CT imaging through the chest, abdomen and pelvis was performed using the standard protocol during bolus administration of intravenous contrast. Multiplanar reconstructed images and MIPs were obtained and reviewed to evaluate the vascular anatomy. CONTRAST:  186m ISOVUE-370 IOPAMIDOL (ISOVUE-370) INJECTION 76% COMPARISON:  None. FINDINGS: CTA CHEST FINDINGS Cardiovascular: There is no intramural hematoma evident on the noncontrast enhanced study. There is no appreciable thoracic aortic aneurysm or dissection. There is extensive atherosclerotic plaque in the arch and descending thoracic aorta with multiple areas of irregularity along the wall of the descending aorta. Visualized great vessels appear unremarkable. No pulmonary embolus evident. There is  no appreciable pericardial effusion or pericardial thickening. Mediastinum/Nodes: Thyroid appears unremarkable. There are scattered subcentimeter mediastinal lymph nodes. There is a lymph node in the right hilum measuring 1.5 x 1.3 cm. No esophageal lesions are appreciable. Lungs/Pleura: There is bibasilar atelectatic change. There is no frank edema or consolidation. No appreciable pleural effusion or pleural thickening. Musculoskeletal: There are no blastic or lytic bone lesions. There is degenerative change in the thoracic spine. Review of the MIP images confirms the above findings. CTA ABDOMEN AND  PELVIS FINDINGS VASCULAR Aorta: There is extensive atherosclerotic calcification in the aorta. There is aneurysmal dilatation in the distal aorta measuring approximately 9.9 x 7.7 cm. Extensive thrombus is noted throughout much of this aneurysm. No appreciable dissection. There is active extravasation of contrast from the mid abdominal aorta extending to the left. There is a large hematoma in the left retroperitoneum with active extravasation extending into this hematoma. This hematoma extends well into the left pelvis. Currently, the hematoma measures approximately 22.7 x 13.9 x 7.8 cm. Note that the area of contrast extravasation from the aorta is distal to the renal arteries. Celiac: There is approximately 90% diameter stenosis at the origin of the celiac axis. No celiac artery aneurysm or dissection. Celiac artery branches appear patent. SMA: There is moderate atherosclerotic calcification in the proximal superior mesenteric artery with approximately 50% diameter stenosis proximally. No aneurysm or dissection. Major mesenteric branches appear patent. Renals: A single renal artery is seen on the right. There is a main renal artery on the left with a tiny accessory renal artery supplying the superior left kidney. There is atherosclerotic irregularity in each proximal renal artery. There is approximately 90% diameter  stenosis at the proximal aspect of the left main renal artery. No dissection in either vessel. No fibromuscular dysplasia evident. 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 Inflow: There is extensive atherosclerotic irregularity in both common iliac arteries. There are multiple areas of irregular atherosclerosis. No stenosis greater than 50% diameter is seen in these vessels. Note that there is a focal dissection in the proximal right common iliac artery extending over a distance of just over 1 cm. No other pelvic arterial dissection evident. There is moderate atherosclerotic calcification in both common femoral arteries. There is 50-60% diameter stenosis in each distal common femoral artery. The visualized proximal superficial femoral and profunda femoral arteries are patent. Veins: No obvious venous abnormality within the limitations of this arterial phase study. Review of the MIP images confirms the above findings. NON-VASCULAR Hepatobiliary: No focal liver lesions are evident. Gallbladder wall is not appreciably thickened. There is no biliary duct dilatation. Pancreas: No pancreatic mass or inflammatory focus. Spleen: No splenic lesions are evident. Adrenals/Urinary Tract: No adrenal lesions are evident. There is no renal mass or hydronephrosis on either side. The retroperitoneal hematoma on the left extends superiorly to impress upon the left kidney from a posterior approach. There is fluid extending into the left perinephric region from the hematoma arising from the aorta on the left side. No renal or ureteral calculus evident. Urinary bladder is midline with wall thickness within normal limits. Stomach/Bowel: There are multiple sigmoid diverticula without diverticulitis. There is no appreciable bowel wall thickening. No bowel obstruction. No free air or portal venous air. Lymphatic: Several prominent periprostatic region lymph nodes are noted, largest measuring 1.5 x 1.4 cm. No adenopathy  elsewhere. Reproductive: Prostate appears normal in size and contour. The seminal vesicles appear somewhat irregular in contour and prominent. No well-defined pelvic mass. Other: No abscess or free fluid in the abdomen or pelvis noted. No periappendiceal region inflammation evident. Musculoskeletal: There is degenerative change in the lumbar spine. No blastic or lytic bone lesions are evident. No intramuscular lesion or abdominal wall lesion evident. Review of the MIP images confirms the above findings. IMPRESSION: CT angiogram chest: 1. No thoracic aortic aneurysm or dissection. There is atherosclerotic calcification in the arch and descending aorta with multiple areas of atherosclerotic irregularity along the descending aorta. 2.  No evident pulmonary embolus.  3.  Patchy bibasilar atelectasis.  No consolidation. 4.  Mildly enlarged right hilar lymph node of uncertain etiology. 5.  No pericardial effusion or pericardial thickening. CT angiogram abdomen; CT angiogram pelvis 1. Abdominal aortic aneurysm measuring 9.9 x 7.7 cm. There is active contrast extravasation from the leftward aspect of the mid the distal aorta inferior to the renal arteries with large left retroperitoneal hematoma extending into the left pelvis. Appearance is consistent with active leakage from aortic aneurysm. Note that there is thrombus throughout much of the abdominal aortic aneurysm. 2. Extensive atherosclerotic irregularity in multiple mesenteric and pelvic arterial vessels. Short-segment dissection noted in the proximal right common iliac artery. No other dissection evident. 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 3. Prominent seminal vesicles bilaterally withe periprostatic lymph node prominence. Advise correlation with PSA when patient is clinically stable. 4. Sigmoid diverticula without diverticulitis. No bowel obstruction. No abscess. 5. Mild impression on the left kidney due to large left retroperitoneal hematoma. No  hydronephrosis. No renal or ureteral calculi. Critical Value/emergent results were called by telephone at the time of interpretation on 02/12/2017 at 9:15 a.m. to Dr. Virgel Manifold , who verbally acknowledged these results. Due to the severity of the findings in the abdomen, I called report to Dr. Wilson Singer prior to initiating the dictation. Electronically Signed   By: Lowella Grip III M.D.   On: 02/12/2017 09:32    Cardiac Studies   Echo pending  Patient Profile     67 y.o. male with cardiac arrest - PEA and VF after ruptured AAA repair, underwent emergent cath and IABP placement  Assessment & Plan    1. Cardiac arrest/VF: continue amiodarone, review echo - initial review shows fairly well-preserved LV function 2. Shock, cardiogenic: remains on phenylephrine, Swan hemodynamics reviewed CI 1.7, PAP mean 28, IABP removed secondary to ischemic leg. Consider change from phenylephrine to norepi to augment CO. Will discuss with CCM team. 3. VDRF: per CCM team 4. Ruptured AAA, ischemic legs, possible compartment syndrome: management per VVS 5. Acute renal failure: pt anuric, plans for CVVHD noted 6. Demand ischemia: troponin 4.5, diffuse 3 vessel CAD, medical management indicated.   The patient is critically ill with multiple organ systems failure and requires high complexity decision making for assessment and support, frequent evaluation and titration of therapies, application of advanced monitoring technologies and extensive interpretation of multiple databases. Case discussed with Dr Trula Slade and with patient's wife.   Critical Care Time devoted to patient care services described in this note is 35 min  For questions or updates, please contact [place] Please consult [email or web address] for contact info under Cardiology/STEMI.    Signed, Sherren Mocha, MD  03/05/2017, 9:08 AM

## 2017-03-08 NOTE — Progress Notes (Signed)
  2D Echocardiogram has been performed.  Roosvelt MaserLane, Torin Whisner F 02/25/2017, 9:14 AM

## 2017-03-08 NOTE — Progress Notes (Signed)
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  This was complicated by cardiac arrest after the procedure was completed.  He had coronary angiography and placement of a balloon pump.  He developed occlusion of both femoral arteries requiring a jump graft from the right limb of the graft to the right femoral artery, and removal of the balloon pump on the left.  He was taken to the ICU in critical condition.  The patient remains on pressors and critically ill.  He still has mottling of his abdomen and both legs.  I was able to get brisk Doppler signals in both groins as well as both dorsalis pedis arteries.  The feet do appear mottled and ischemic however with pulses I suspect this is secondary to his pressors.  There is no evidence of compartment syndrome in either leg.  He will continue to be closely monitored.  The family was at the bedside and updated.  I spoke with critical care who will assist in the patient's management.  Durene CalWells Brabham

## 2017-03-08 DEATH — deceased

## 2018-12-10 IMAGING — CT CT ANGIO CHEST-ABD-PELV FOR DISSECTION W/ AND WO/W CM
2 of 7 series · 11 of 36 positions shown, 14 images · IV contrast (iopamidol)
Comparison: None.

CLINICAL DATA: Chest and abdominal pain

EXAM:
CT ANGIOGRAPHY CHEST, ABDOMEN AND PELVIS
TECHNIQUE: Initially, axial CT images were obtained through the chest without
intravenous contrast material. Multidetector CT imaging through the
chest, abdomen and pelvis was performed using the standard protocol
during bolus administration of intravenous contrast. Multiplanar
reconstructed images and MIPs were obtained and reviewed to evaluate
the vascular anatomy.
CONTRAST:  100mL CLP2GN-0GW IOPAMIDOL (CLP2GN-0GW) INJECTION 76%

[Series 5: dissection 2mm · axial · 0.88mm/px · z∈[+754,+1386]mm · 10 of 362 slices shown, 13 images]
[im 23/362  mediastinal]
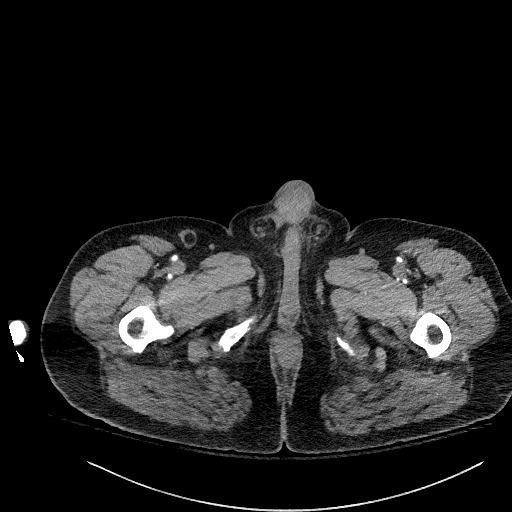
[im 23/362  bone]
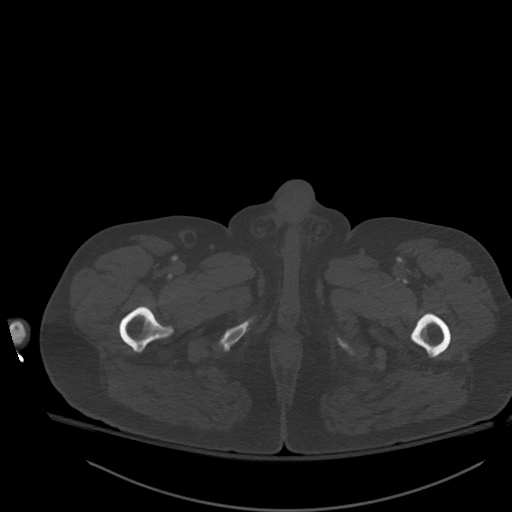
[im 68/362  mediastinal]
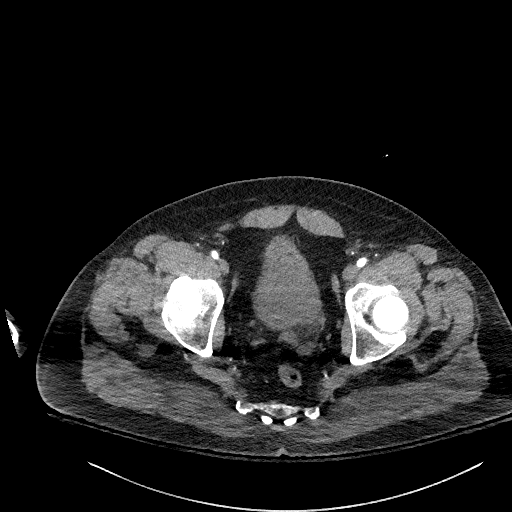
[im 113/362  mediastinal]
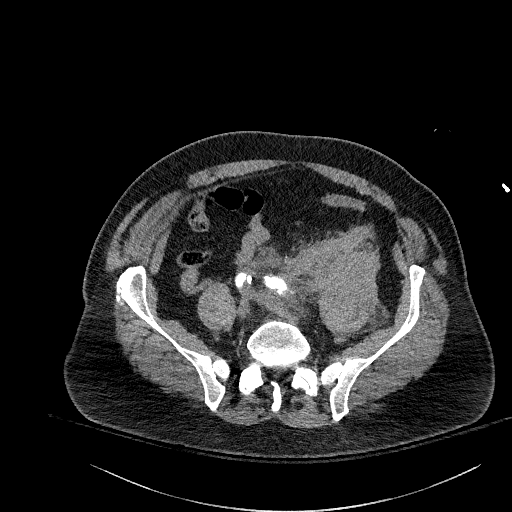
[im 158/362  mediastinal]
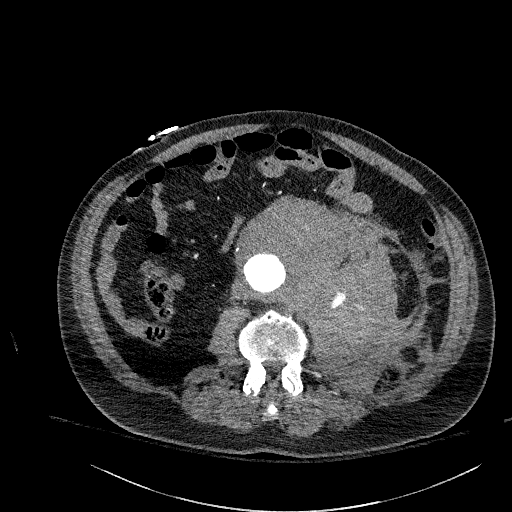
[im 204/362  mediastinal]
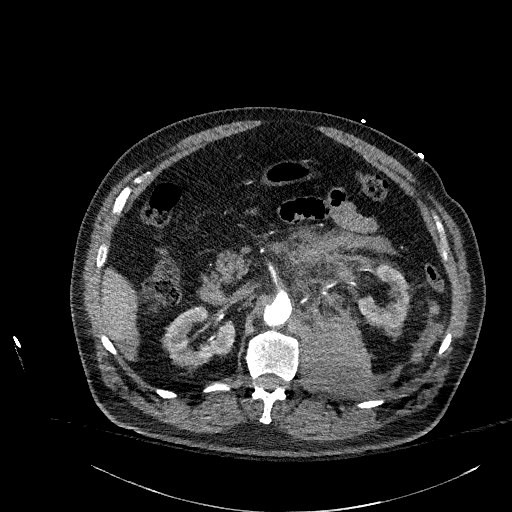
[im 249/362  mediastinal]
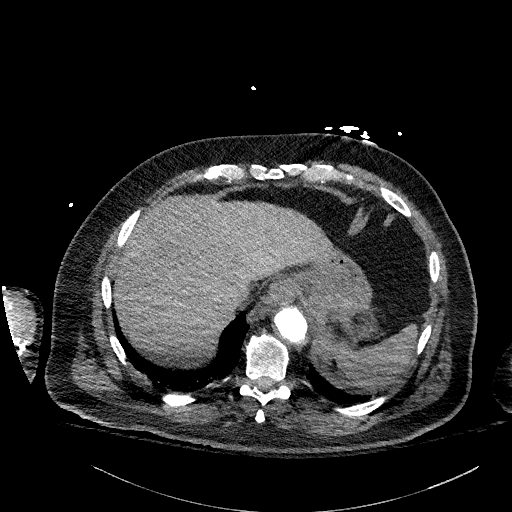
[im 271/362  lung]
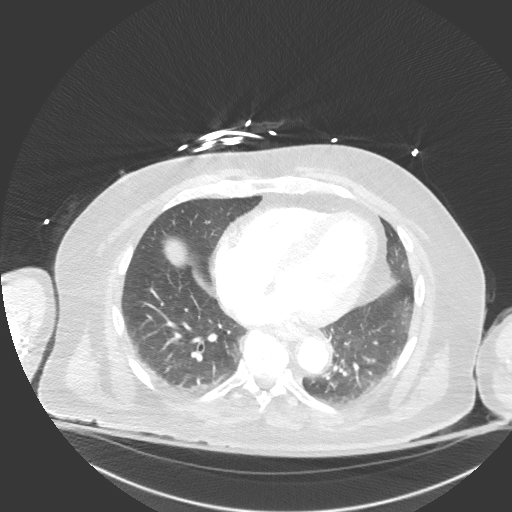
[im 294/362  mediastinal]
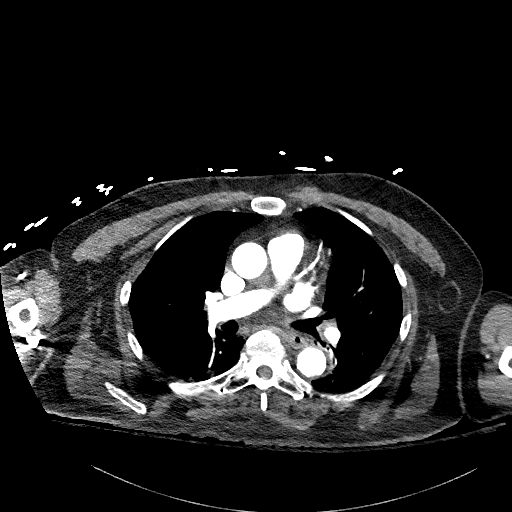
[im 294/362  lung]
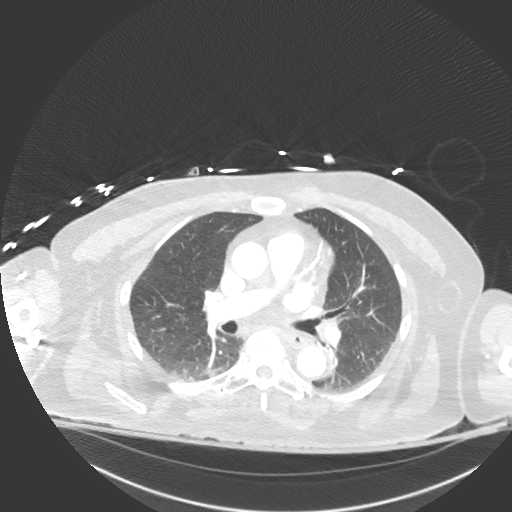
[im 316/362  lung]
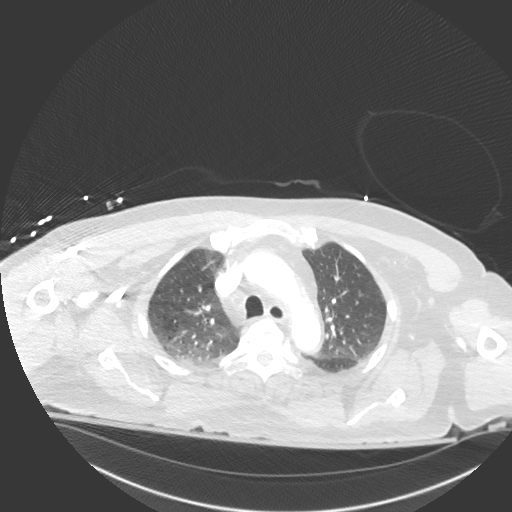
[im 339/362  mediastinal]
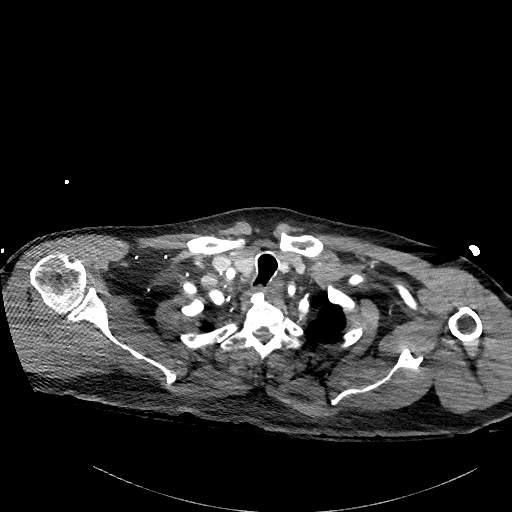
[im 339/362  lung]
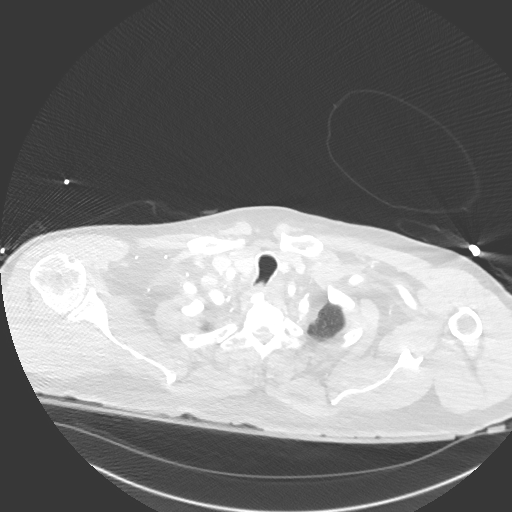

[Series 8: dissection 2mm cor · coronal · 0.87mm/px · 1 of 145 slices shown]
[im 73/145  mediastinal]
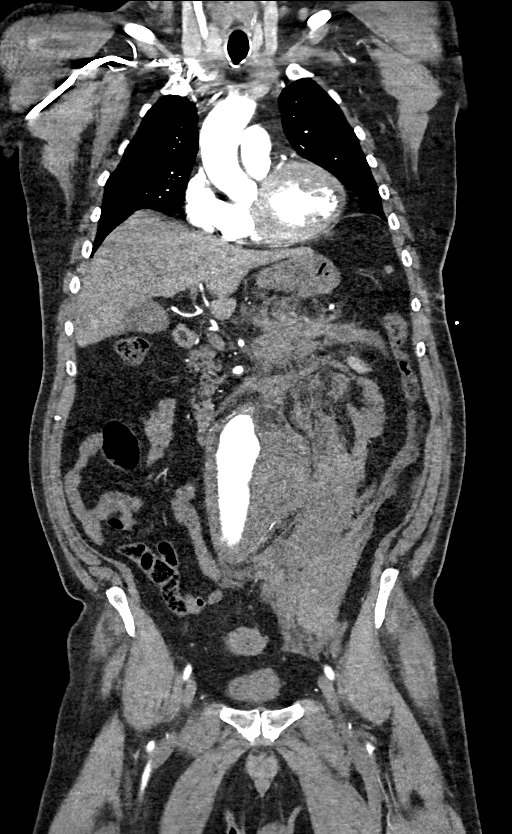

[11 of 36 positions shown; findings below may reference images not displayed]

FINDINGS: CTA CHEST FINDINGS

Cardiovascular: There is no intramural hematoma evident on the
noncontrast enhanced study. There is no appreciable thoracic aortic
aneurysm or dissection. There is extensive atherosclerotic plaque in
the arch and descending thoracic aorta with multiple areas of
irregularity along the wall of the descending aorta. Visualized
great vessels appear unremarkable. No pulmonary embolus evident.
There is no appreciable pericardial effusion or pericardial
thickening.

Mediastinum/Nodes: Thyroid appears unremarkable. There are scattered
subcentimeter mediastinal lymph nodes. There is a lymph node in the
right hilum measuring 1.5 x 1.3 cm. No esophageal lesions are
appreciable.

Lungs/Pleura: There is bibasilar atelectatic change. There is no
frank edema or consolidation. No appreciable pleural effusion or
pleural thickening.

Musculoskeletal: There are no blastic or lytic bone lesions. There
is degenerative change in the thoracic spine.

Review of the MIP images confirms the above findings.

CTA ABDOMEN AND PELVIS FINDINGS

VASCULAR

Aorta: There is extensive atherosclerotic calcification in the
aorta. There is aneurysmal dilatation in the distal aorta measuring
approximately 9.9 x 7.7 cm. Extensive thrombus is noted throughout
much of this aneurysm. No appreciable dissection.

There is active extravasation of contrast from the mid abdominal
aorta extending to the left. There is a large hematoma in the left
retroperitoneum with active extravasation extending into this
hematoma. This hematoma extends well into the left pelvis.
Currently, the hematoma measures approximately 22.7 x 13.9 x 7.8 cm.
Note that the area of contrast extravasation from the aorta is
distal to the renal arteries.

Celiac: There is approximately 90% diameter stenosis at the origin
of the celiac axis. No celiac artery aneurysm or dissection. Celiac
artery branches appear patent.

SMA: There is moderate atherosclerotic calcification in the proximal
superior mesenteric artery with approximately 50% diameter stenosis
proximally. No aneurysm or dissection. Major mesenteric branches
appear patent.

Renals: A single renal artery is seen on the right. There is a main
renal artery on the left with a tiny accessory renal artery
supplying the superior left kidney. There is atherosclerotic
irregularity in each proximal renal artery. There is approximately
90% diameter stenosis at the proximal aspect of the left main renal
artery. No dissection in either vessel. No fibromuscular dysplasia
evident.

IMA: Inferior mesenteric artery is occluded at its origin.

Inflow: There is extensive atherosclerotic irregularity in both
common iliac arteries. There are multiple areas of irregular
atherosclerosis. No stenosis greater than 50% diameter is seen in
these vessels. Note that there is a focal dissection in the proximal
right common iliac artery extending over a distance of just over 1
cm. No other pelvic arterial dissection evident. There is moderate
atherosclerotic calcification in both common femoral arteries. There
is 50-60% diameter stenosis in each distal common femoral artery.
The visualized proximal superficial femoral and profunda femoral
arteries are patent.

Veins: No obvious venous abnormality within the limitations of this
arterial phase study.

Review of the MIP images confirms the above findings.

NON-VASCULAR

Hepatobiliary: No focal liver lesions are evident. Gallbladder wall
is not appreciably thickened. There is no biliary duct dilatation.

Pancreas: No pancreatic mass or inflammatory focus.

Spleen: No splenic lesions are evident.

Adrenals/Urinary Tract: No adrenal lesions are evident. There is no
renal mass or hydronephrosis on either side. The retroperitoneal
hematoma on the left extends superiorly to impress upon the left
kidney from a posterior approach. There is fluid extending into the
left perinephric region from the hematoma arising from the aorta on
the left side. No renal or ureteral calculus evident. Urinary
bladder is midline with wall thickness within normal limits.

Stomach/Bowel: There are multiple sigmoid diverticula without
diverticulitis. There is no appreciable bowel wall thickening. No
bowel obstruction. No free air or portal venous air.

Lymphatic: Several prominent periprostatic region lymph nodes are
noted, largest measuring 1.5 x 1.4 cm. No adenopathy elsewhere.

Reproductive: Prostate appears normal in size and contour. The
seminal vesicles appear somewhat irregular in contour and prominent.
No well-defined pelvic mass.

Other: No abscess or free fluid in the abdomen or pelvis noted. No
periappendiceal region inflammation evident.

Musculoskeletal: There is degenerative change in the lumbar spine.
No blastic or lytic bone lesions are evident. No intramuscular
lesion or abdominal wall lesion evident.

Review of the MIP images confirms the above findings.
IMPRESSION: CT angiogram chest:

1. No thoracic aortic aneurysm or dissection. There is
atherosclerotic calcification in the arch and descending aorta with
multiple areas of atherosclerotic irregularity along the descending
aorta.

2.  No evident pulmonary embolus.

3.  Patchy bibasilar atelectasis.  No consolidation.

4.  Mildly enlarged right hilar lymph node of uncertain etiology.

5.  No pericardial effusion or pericardial thickening.

CT angiogram abdomen; CT angiogram pelvis

1. Abdominal aortic aneurysm measuring 9.9 x 7.7 cm. There is active
contrast extravasation from the leftward aspect of the mid the
distal aorta inferior to the renal arteries with large left
retroperitoneal hematoma extending into the left pelvis. Appearance
is consistent with active leakage from aortic aneurysm. Note that
there is thrombus throughout much of the abdominal aortic aneurysm.

2. Extensive atherosclerotic irregularity in multiple mesenteric and
pelvic arterial vessels. Short-segment dissection noted in the
proximal right common iliac artery. No other dissection evident.
Note that the inferior mesenteric artery is occluded.

3. Prominent seminal vesicles bilaterally withe periprostatic lymph
node prominence. Advise correlation with PSA when patient is
clinically stable.

4. Sigmoid diverticula without diverticulitis. No bowel obstruction.
No abscess.

5. Mild impression on the left kidney due to large left
retroperitoneal hematoma. No hydronephrosis. No renal or ureteral
calculi.

Critical Value/emergent results were called by telephone at the time
of interpretation on 02/13/2017 at [DATE] a.m. to Dr. FORWOOD DORAMBARI ,
who verbally acknowledged these results. Due to the severity of the
findings in the abdomen, I called report to Dr. Amukwaya prior to
initiating the dictation.

## 2018-12-10 IMAGING — CR DG ABDOMEN 1V
1 series · 1 of 1 positions shown · non-contrast
Comparison: CT 02/13/2017

CLINICAL DATA: abdominal aortic aneurysm repair.  Instrument count

EXAM:
ABDOMEN - 1 VIEW

[AP]
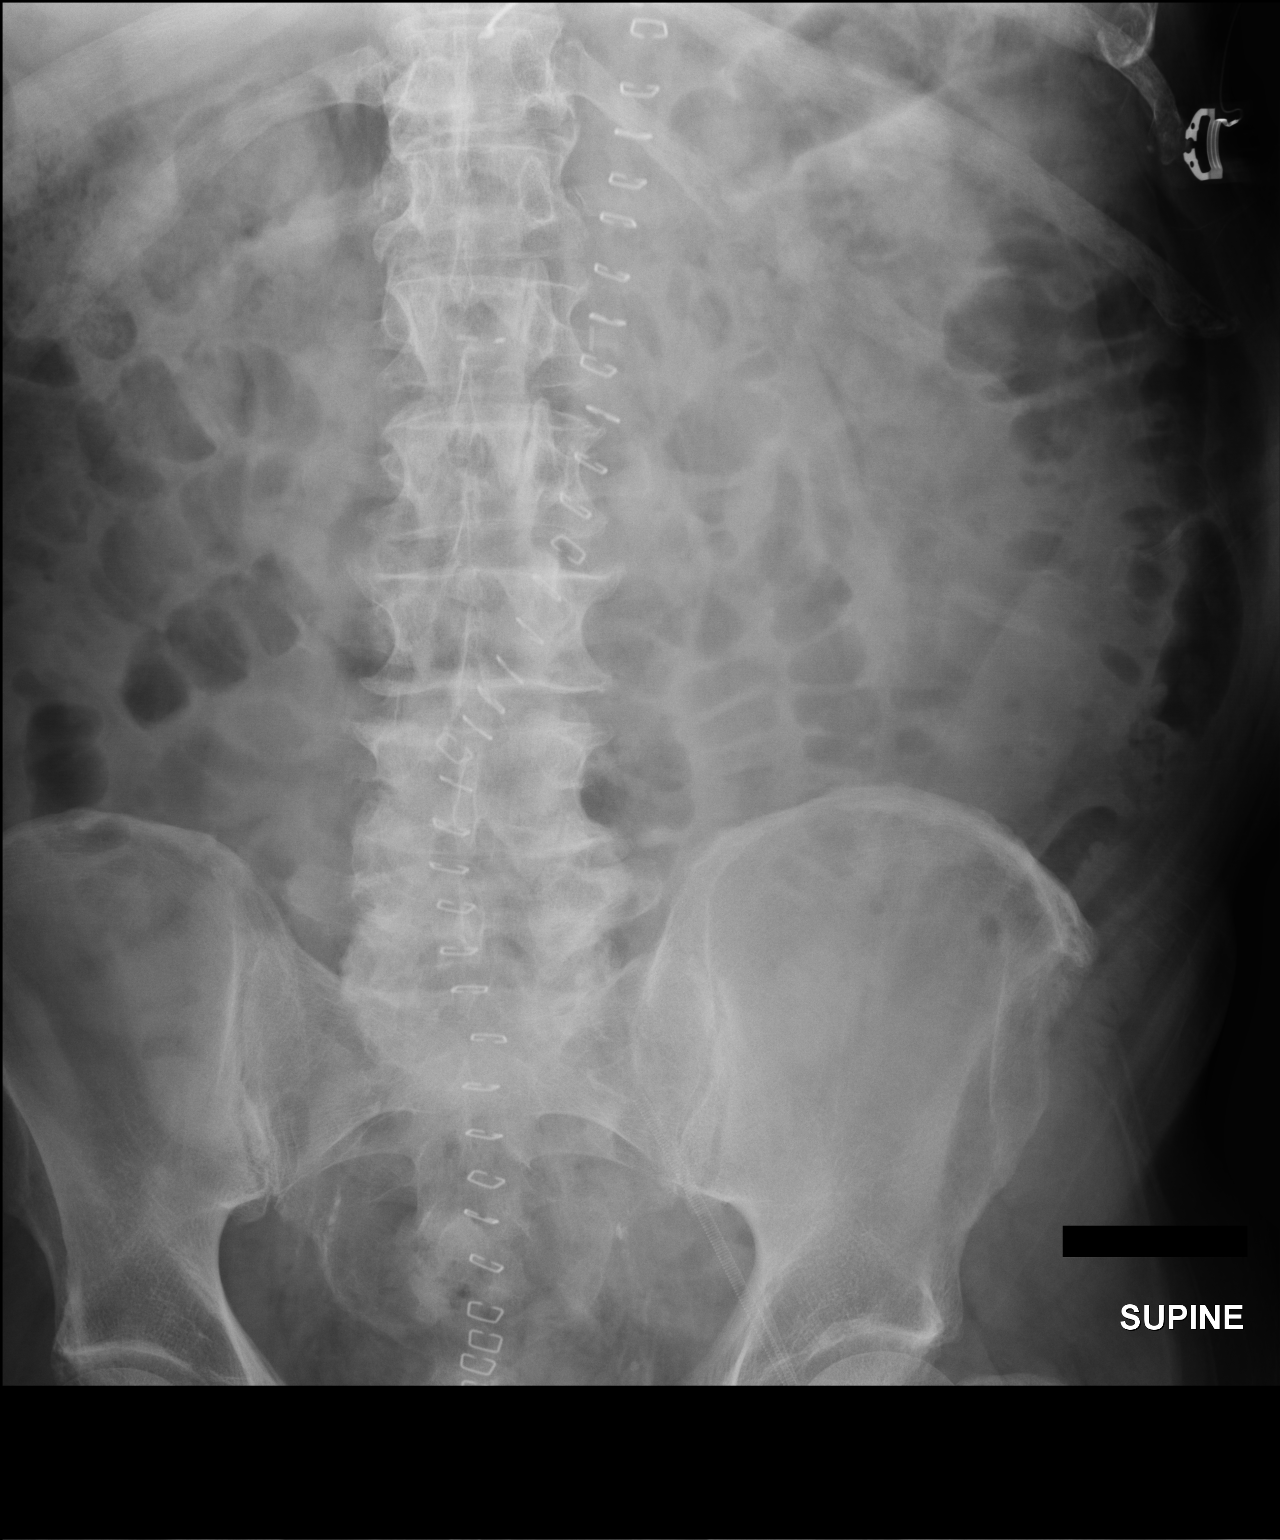

[1 of 1 positions shown; findings below may reference images not displayed]

FINDINGS: Midline skin staples noted. LEFT femoral central venous line
present. No retained surgical instruments on imaged abdomen. Portion
of the lateral RIGHT abdomen excluded. Tip of the NG tube
identified.
IMPRESSION: No retained surgical instruments in the field of view.

## 2018-12-11 IMAGING — DX DG CHEST 1V PORT
1 series · 1 of 1 positions shown · non-contrast
Comparison: 02/13/2017.

CLINICAL DATA: Intubation.

EXAM:
PORTABLE CHEST 1 VIEW

[chest ap]
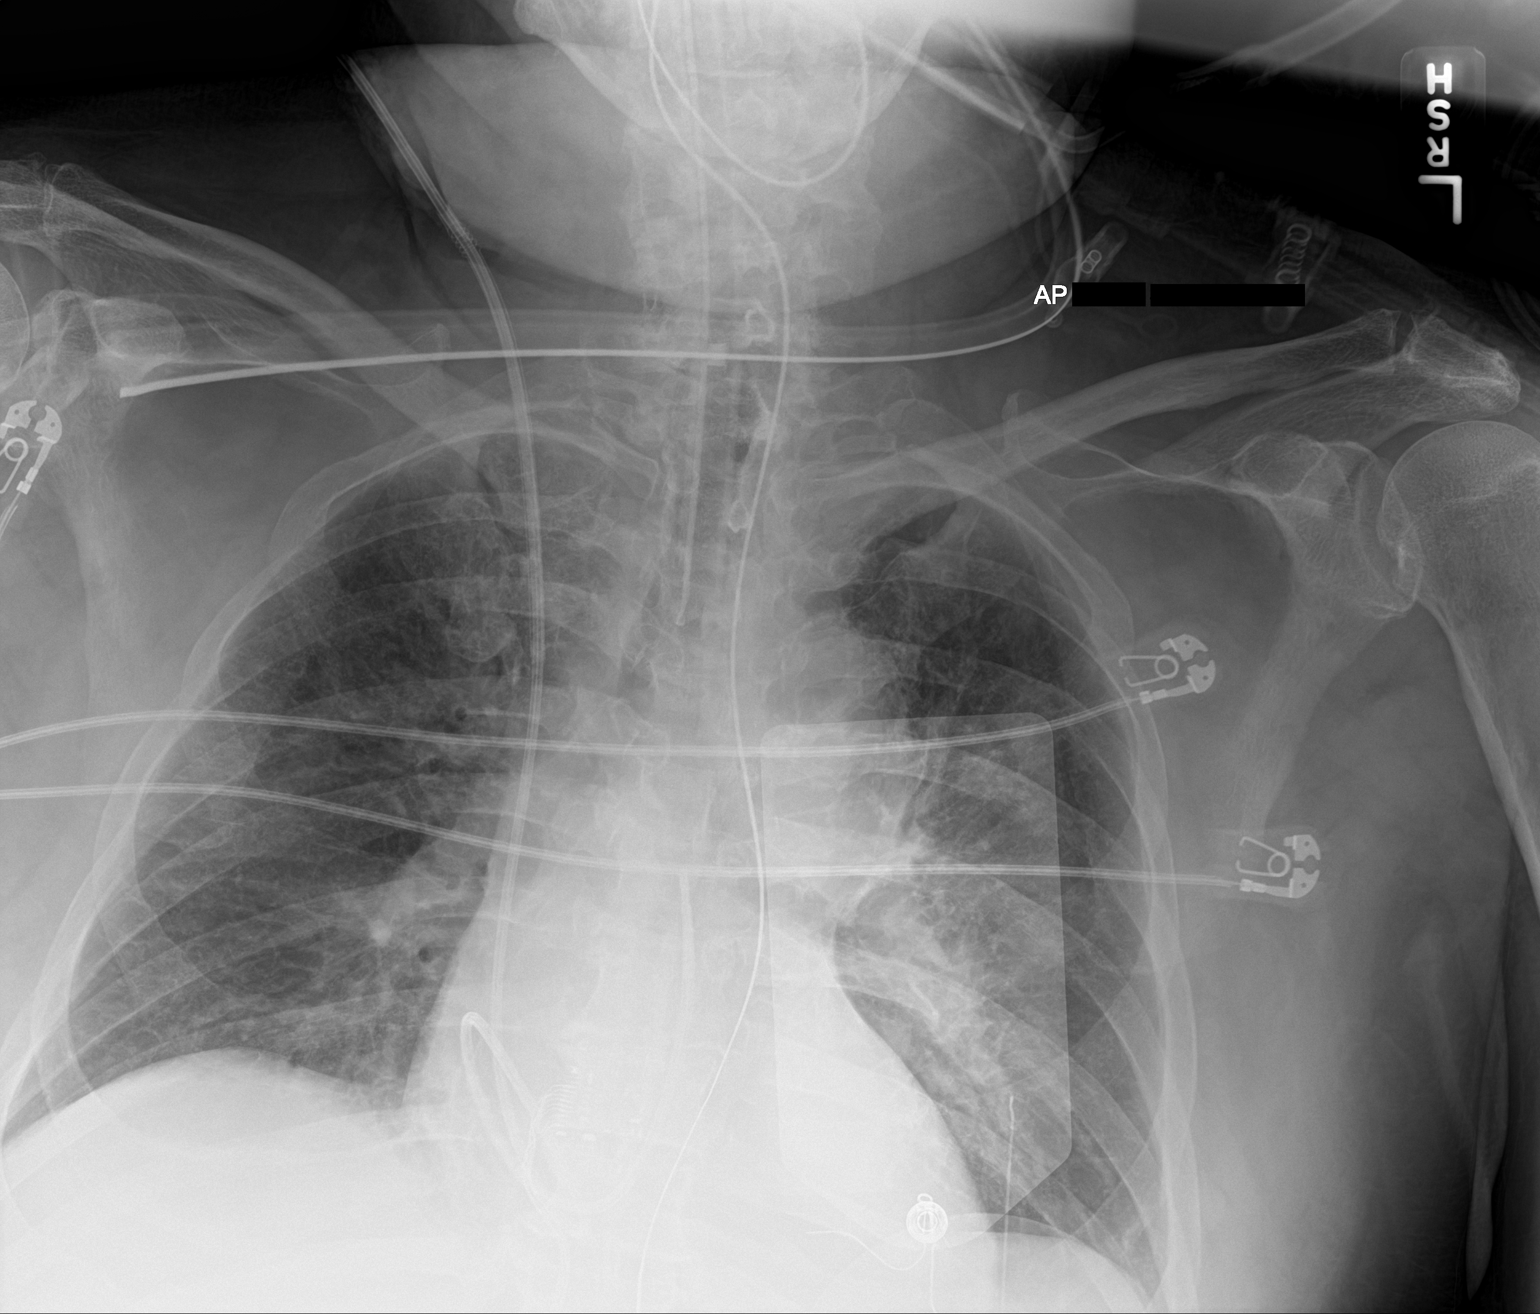

[1 of 1 positions shown; findings below may reference images not displayed]

FINDINGS: Endotracheal tube, NG tube, Swan-Ganz catheter in stable position.
Swan-Ganz catheter is curled on itself. Previously identified
metallic density noted over the left chest no longer identified.
Heart size is stable. Interim partial clearing of bilateral from
interstitial prominence suggesting partial clearing of pulmonary
interstitial edema.
IMPRESSION: 1.  Lines and tubes in unchanged position.

2. Interim partial clearing of pulmonary interstitial prominence
consistent partial clearing of interstitial edema.

## 2018-12-11 IMAGING — CR DG CHEST 1V PORT
1 series · 1 of 1 positions shown · non-contrast
Comparison: February 14, 2017 study obtained earlier in the day

CLINICAL DATA: Hypoxia

EXAM:
PORTABLE CHEST 1 VIEW

[AP]
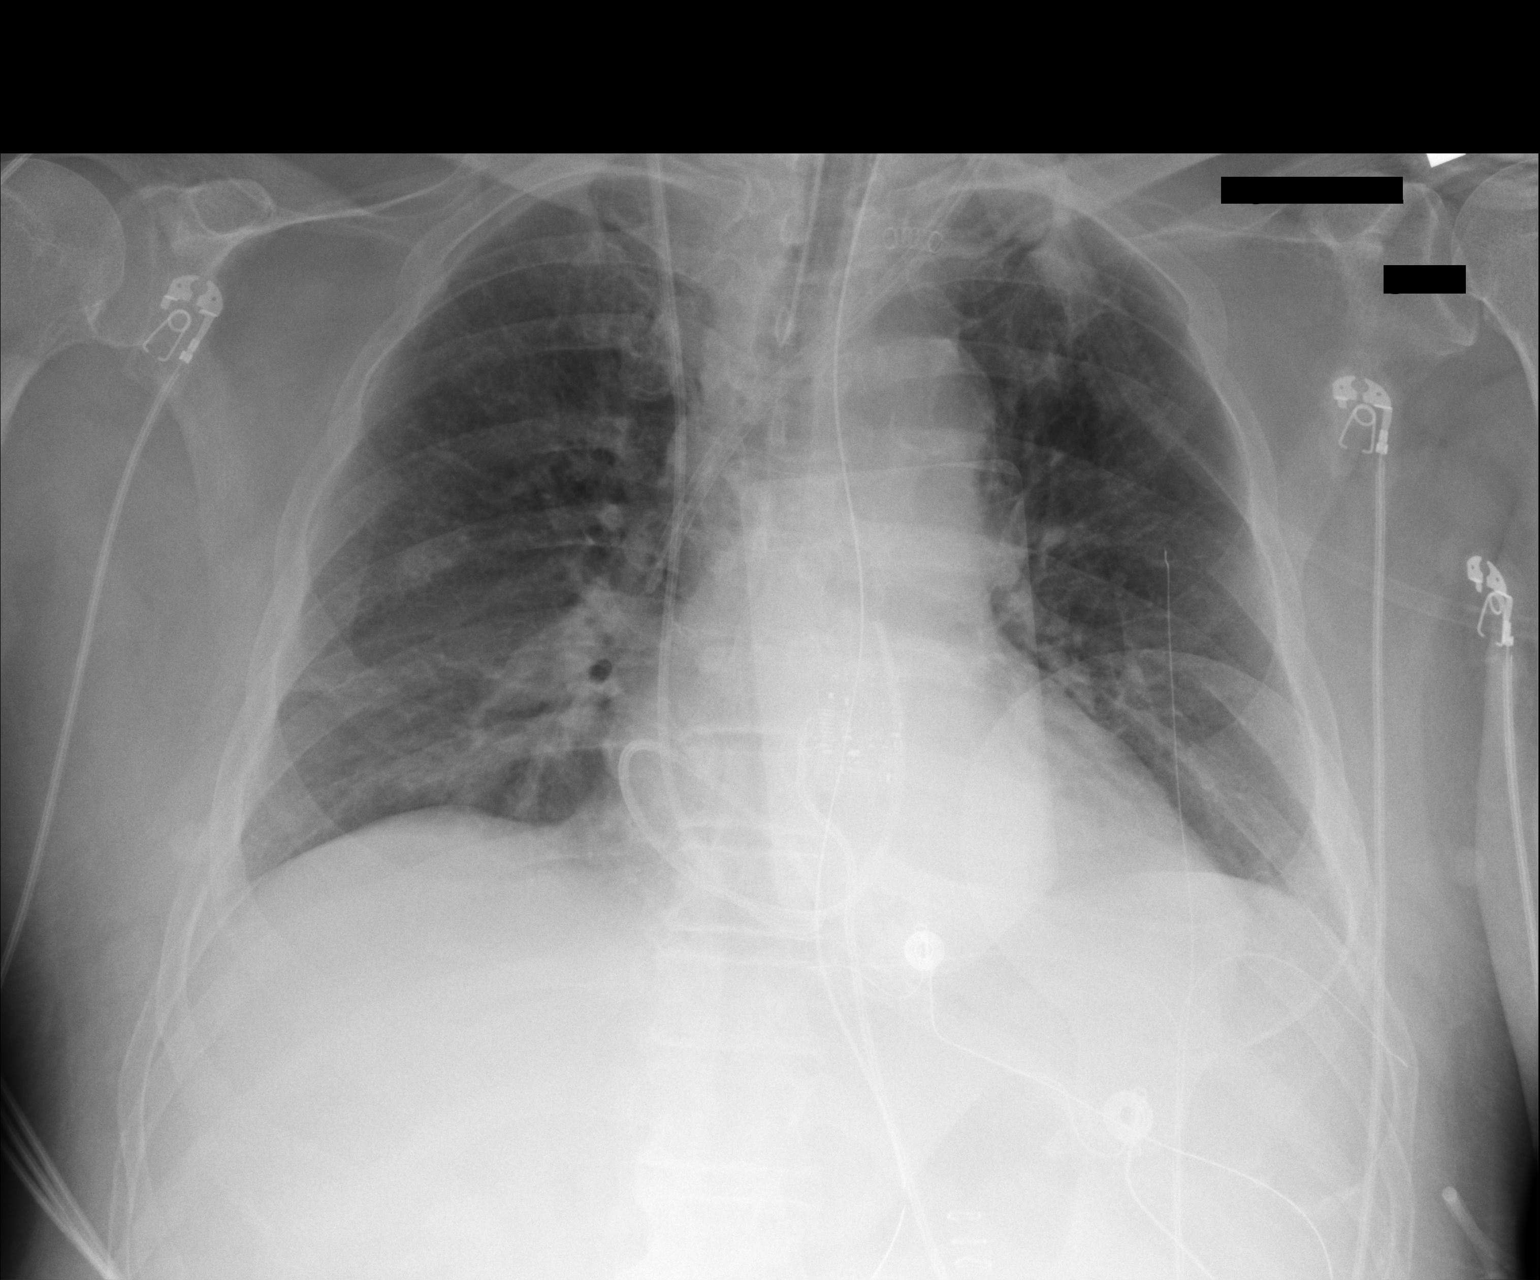

[1 of 1 positions shown; findings below may reference images not displayed]

FINDINGS: Left jugular catheter tip is in the superior vena cava. Nasogastric
tube tip is below the diaphragm with the side port seen in the
stomach. Endotracheal tube tip is 4.8 cm above the carina. Swan-Ganz
catheter coils in the right atrium. The tip of the Swan-Ganz
catheter is near the pulmonic valve, likely in the proximal main
pulmonary outflow tract. No evident pneumothorax.

No edema or consolidation. Heart size and pulmonary vascularity are
normal. No adenopathy. No bone lesions. There is aortic
atherosclerosis.
IMPRESSION: Tube and catheter positions as described without pneumothorax. Note
that the Swan-Ganz catheter remains coiled in the right atrium with
the tip near the pulmonic valve, likely in the proximal main
pulmonary outflow tract.

No edema or consolidation. Heart size normal. There is aortic
atherosclerosis.

Aortic Atherosclerosis ([id number]).
